# Patient Record
Sex: Female | Born: 1965 | Race: White | Hispanic: Yes | Marital: Married | Smoking: Never smoker
Health system: Southern US, Community
[De-identification: ages and names within clinical notes are randomized; demographics above are authoritative.]

## PROBLEM LIST (undated history)

## (undated) DIAGNOSIS — I10 Essential (primary) hypertension: Secondary | ICD-10-CM

## (undated) DIAGNOSIS — D219 Benign neoplasm of connective and other soft tissue, unspecified: Secondary | ICD-10-CM

## (undated) HISTORY — PX: TUBAL LIGATION: SHX77

## (undated) HISTORY — DX: Benign neoplasm of connective and other soft tissue, unspecified: D21.9

---

## 2000-07-03 ENCOUNTER — Encounter: Payer: Self-pay | Admitting: *Deleted

## 2000-07-03 ENCOUNTER — Emergency Department (HOSPITAL_COMMUNITY): Admission: EM | Admit: 2000-07-03 | Discharge: 2000-07-03 | Payer: Self-pay | Admitting: *Deleted

## 2002-06-07 ENCOUNTER — Emergency Department (HOSPITAL_COMMUNITY): Admission: EM | Admit: 2002-06-07 | Discharge: 2002-06-07 | Payer: Self-pay | Admitting: *Deleted

## 2002-06-07 ENCOUNTER — Encounter: Payer: Self-pay | Admitting: *Deleted

## 2003-07-29 ENCOUNTER — Ambulatory Visit (HOSPITAL_COMMUNITY): Admission: RE | Admit: 2003-07-29 | Discharge: 2003-07-29 | Payer: Self-pay | Admitting: Family Medicine

## 2005-08-15 ENCOUNTER — Emergency Department (HOSPITAL_COMMUNITY): Admission: EM | Admit: 2005-08-15 | Discharge: 2005-08-15 | Payer: Self-pay | Admitting: Emergency Medicine

## 2005-08-16 ENCOUNTER — Emergency Department (HOSPITAL_COMMUNITY): Admission: EM | Admit: 2005-08-16 | Discharge: 2005-08-16 | Payer: Self-pay | Admitting: Emergency Medicine

## 2006-11-14 ENCOUNTER — Other Ambulatory Visit: Admission: RE | Admit: 2006-11-14 | Discharge: 2006-11-14 | Payer: Self-pay | Admitting: Internal Medicine

## 2006-12-05 ENCOUNTER — Ambulatory Visit (HOSPITAL_COMMUNITY): Admission: RE | Admit: 2006-12-05 | Discharge: 2006-12-05 | Payer: Self-pay | Admitting: Internal Medicine

## 2007-09-11 ENCOUNTER — Emergency Department (HOSPITAL_COMMUNITY): Admission: EM | Admit: 2007-09-11 | Discharge: 2007-09-11 | Payer: Self-pay | Admitting: Emergency Medicine

## 2008-07-27 ENCOUNTER — Emergency Department (HOSPITAL_COMMUNITY): Admission: EM | Admit: 2008-07-27 | Discharge: 2008-07-27 | Payer: Self-pay | Admitting: Emergency Medicine

## 2010-03-20 ENCOUNTER — Encounter: Payer: Self-pay | Admitting: Family Medicine

## 2010-06-07 LAB — URINE MICROSCOPIC-ADD ON

## 2010-06-07 LAB — URINALYSIS, ROUTINE W REFLEX MICROSCOPIC
Glucose, UA: NEGATIVE mg/dL
Ketones, ur: NEGATIVE mg/dL
Leukocytes, UA: NEGATIVE
Protein, ur: NEGATIVE mg/dL
Urobilinogen, UA: 0.2 mg/dL (ref 0.0–1.0)
pH: 5.5 (ref 5.0–8.0)

## 2010-06-07 LAB — PREGNANCY, URINE: Preg Test, Ur: NEGATIVE

## 2010-12-06 ENCOUNTER — Other Ambulatory Visit (HOSPITAL_COMMUNITY): Payer: Self-pay | Admitting: General Surgery

## 2010-12-06 DIAGNOSIS — Z139 Encounter for screening, unspecified: Secondary | ICD-10-CM

## 2010-12-12 ENCOUNTER — Ambulatory Visit (HOSPITAL_COMMUNITY)
Admission: RE | Admit: 2010-12-12 | Discharge: 2010-12-12 | Disposition: A | Discharge status: Home / Self Care | Payer: BC Managed Care – PPO | Source: Ambulatory Visit | Attending: General Surgery | Admitting: General Surgery

## 2010-12-12 DIAGNOSIS — Z139 Encounter for screening, unspecified: Secondary | ICD-10-CM

## 2010-12-12 DIAGNOSIS — Z1231 Encounter for screening mammogram for malignant neoplasm of breast: Secondary | ICD-10-CM | POA: Insufficient documentation

## 2012-06-27 ENCOUNTER — Other Ambulatory Visit (HOSPITAL_COMMUNITY): Payer: Self-pay | Admitting: *Deleted

## 2012-06-27 DIAGNOSIS — Z139 Encounter for screening, unspecified: Secondary | ICD-10-CM

## 2012-07-02 ENCOUNTER — Ambulatory Visit (HOSPITAL_COMMUNITY)
Admission: RE | Admit: 2012-07-02 | Discharge: 2012-07-02 | Disposition: A | Discharge status: Home / Self Care | Payer: BC Managed Care – PPO | Source: Ambulatory Visit | Attending: *Deleted | Admitting: *Deleted

## 2012-07-02 DIAGNOSIS — Z1231 Encounter for screening mammogram for malignant neoplasm of breast: Secondary | ICD-10-CM | POA: Insufficient documentation

## 2012-07-02 DIAGNOSIS — Z139 Encounter for screening, unspecified: Secondary | ICD-10-CM

## 2012-08-27 ENCOUNTER — Encounter (HOSPITAL_COMMUNITY): Payer: Self-pay | Admitting: *Deleted

## 2012-08-27 ENCOUNTER — Emergency Department (HOSPITAL_COMMUNITY)
Admission: EM | Admit: 2012-08-27 | Discharge: 2012-08-27 | Disposition: A | Discharge status: Home / Self Care | Payer: BC Managed Care – PPO | Attending: Emergency Medicine | Admitting: Emergency Medicine

## 2012-08-27 ENCOUNTER — Emergency Department (HOSPITAL_COMMUNITY): Payer: BC Managed Care – PPO

## 2012-08-27 DIAGNOSIS — R109 Unspecified abdominal pain: Secondary | ICD-10-CM

## 2012-08-27 DIAGNOSIS — M7989 Other specified soft tissue disorders: Secondary | ICD-10-CM | POA: Insufficient documentation

## 2012-08-27 DIAGNOSIS — R110 Nausea: Secondary | ICD-10-CM | POA: Insufficient documentation

## 2012-08-27 DIAGNOSIS — Z3202 Encounter for pregnancy test, result negative: Secondary | ICD-10-CM | POA: Insufficient documentation

## 2012-08-27 DIAGNOSIS — R1031 Right lower quadrant pain: Secondary | ICD-10-CM | POA: Insufficient documentation

## 2012-08-27 LAB — URINALYSIS, ROUTINE W REFLEX MICROSCOPIC
Bilirubin Urine: NEGATIVE
Ketones, ur: NEGATIVE mg/dL
Leukocytes, UA: NEGATIVE
Nitrite: NEGATIVE
Specific Gravity, Urine: 1.025 (ref 1.005–1.030)
Urobilinogen, UA: 0.2 mg/dL (ref 0.0–1.0)
pH: 6 (ref 5.0–8.0)

## 2012-08-27 LAB — PREGNANCY, URINE: Preg Test, Ur: NEGATIVE

## 2012-08-27 LAB — URINE MICROSCOPIC-ADD ON

## 2012-08-27 LAB — CBC WITH DIFFERENTIAL/PLATELET
Basophils Relative: 0 % (ref 0–1)
Hemoglobin: 12.5 g/dL (ref 12.0–15.0)
Lymphocytes Relative: 19 % (ref 12–46)
MCHC: 33.1 g/dL (ref 30.0–36.0)
Neutro Abs: 9 10*3/uL — ABNORMAL HIGH (ref 1.7–7.7)
Platelets: 330 10*3/uL (ref 150–400)
RBC: 4.37 MIL/uL (ref 3.87–5.11)
RDW: 14.6 % (ref 11.5–15.5)
WBC: 12 10*3/uL — ABNORMAL HIGH (ref 4.0–10.5)

## 2012-08-27 LAB — COMPREHENSIVE METABOLIC PANEL
Calcium: 9.5 mg/dL (ref 8.4–10.5)
GFR calc non Af Amer: >90 mL/min (ref >90)
Potassium: 3.6 mEq/L (ref 3.5–5.1)

## 2012-08-27 MED ORDER — IOHEXOL 300 MG/ML  SOLN
50.0000 mL | Freq: Once | INTRAMUSCULAR | Status: AC | PRN
  Administered 2012-08-27: 50 mL via ORAL

## 2012-08-27 MED ORDER — HYDROCODONE-ACETAMINOPHEN 5-325 MG PO TABS: 1.0000 | ORAL_TABLET | Freq: Four times a day (QID) | ORAL | Status: DC | PRN

## 2012-08-27 MED ORDER — IOHEXOL 300 MG/ML  SOLN
100.0000 mL | Freq: Once | INTRAMUSCULAR | Status: AC | PRN
  Administered 2012-08-27: 100 mL via INTRAVENOUS

## 2012-08-27 MED ORDER — ONDANSETRON HCL 4 MG/2ML IJ SOLN
4.0000 mg | Freq: Once | INTRAMUSCULAR | Status: AC
  Administered 2012-08-27: 4 mg via INTRAVENOUS
  Filled 2012-08-27: qty 2

## 2012-08-27 MED ORDER — HYDROMORPHONE HCL PF 1 MG/ML IJ SOLN
1.0000 mg | Freq: Once | INTRAMUSCULAR | Status: AC
  Administered 2012-08-27: 1 mg via INTRAVENOUS
  Filled 2012-08-27: qty 1

## 2012-08-27 NOTE — ED Provider Notes (Signed)
History     This chart was scribed for Sandra Lennert, MD, MD by Smitty Pluck, ED Scribe. The patient was seen in room APA02/APA02 and the patient's care was started at 6:50PM.  CSN: [id number] Arrival date & time 08/27/12  1632  Chief Complaint  Patient presents with  . Abdominal Pain  . Nausea  . Leg Swelling   Patient is a 47 y.o. female presenting with abdominal pain. The history is provided by the patient and medical records. No language interpreter was used.  Abdominal Pain This is a new problem. The current episode started more than 2 days ago. The problem occurs constantly. The problem has not changed since onset.Associated symptoms include abdominal pain. Pertinent negatives include no chest pain and no headaches. Nothing aggravates the symptoms. Nothing relieves the symptoms. She has tried nothing for the symptoms.   HPI Comments: Sandra Benson is a 47 y.o. female who presents to the Emergency Department complaining of constant, moderate RLQ pain onset 3 days ago. Pt denies trauma to abdomen, fever, chills, nausea, vomiting, diarrhea, weakness, cough, SOB and any other pain.   History reviewed. No pertinent past medical history. History reviewed. No pertinent past surgical history. No family history on file. History  Substance Use Topics  . Smoking status: Never Smoker   . Smokeless tobacco: Not on file  . Alcohol Use: No   OB History   Grav Para Term Preterm Abortions TAB SAB Ect Mult Living                 Review of Systems  Constitutional: Negative for appetite change and fatigue.  HENT: Negative for congestion, sinus pressure and ear discharge.   Eyes: Negative for discharge.  Respiratory: Negative for cough.   Cardiovascular: Negative for chest pain.  Gastrointestinal: Positive for abdominal pain. Negative for diarrhea.  Genitourinary: Negative for frequency and hematuria.  Musculoskeletal: Negative for back pain.  Skin: Negative for rash.   Neurological: Negative for seizures and headaches.  Psychiatric/Behavioral: Negative for hallucinations.    Allergies  Review of patient's allergies indicates no known allergies.  Home Medications  No current outpatient prescriptions on file. BP 176/78  Pulse 71  Temp(Src) 98.9 F (37.2 C)  Resp 20  Ht 5\' 1"  (1.549 m)  Wt 165 lb (74.844 kg)  BMI 31.19 kg/m2  SpO2 100%  LMP 08/11/2012 Physical Exam  Nursing note and vitals reviewed. Constitutional: She is oriented to person, place, and time. She appears well-developed.  HENT:  Head: Normocephalic.  Eyes: Conjunctivae and EOM are normal. No scleral icterus.  Neck: Neck supple. No thyromegaly present.  Cardiovascular: Normal rate and regular rhythm.  Exam reveals no gallop and no friction rub.   No murmur heard. Pulmonary/Chest: No stridor. She has no wheezes. She has no rales. She exhibits no tenderness.  Abdominal: She exhibits no distension. There is tenderness (moderate RLQ ). There is no rebound.  Musculoskeletal: Normal range of motion. She exhibits no edema.  Lymphadenopathy:    She has no cervical adenopathy.  Neurological: She is oriented to person, place, and time. Coordination normal.  Skin: No rash noted. No erythema.  Psychiatric: She has a normal mood and affect. Her behavior is normal.    ED Course  Procedures (including critical care time) DIAGNOSTIC STUDIES: Oxygen Saturation is 100% on room air, normal by my interpretation.    COORDINATION OF CARE: 6:57 PM Discussed ED treatment with pt and pt agrees.  Medications  HYDROmorphone (DILAUDID) injection 1 mg (1  mg Intravenous Given 08/27/12 1920)  ondansetron (ZOFRAN) injection 4 mg (4 mg Intravenous Given 08/27/12 1920)  iohexol (OMNIPAQUE) 300 MG/ML solution 50 mL (50 mLs Oral Contrast Given 08/27/12 2021)    9:56 PM Recheck: Discussed lab results and treatment course with pt. Pt is told that she has fibroids. Pt is ready for discharge.    Labs Reviewed   URINALYSIS, ROUTINE W REFLEX MICROSCOPIC - Abnormal; Notable for the following:    Hgb urine dipstick SMALL (*)    All other components within normal limits  CBC WITH DIFFERENTIAL - Abnormal; Notable for the following:    WBC 12.0 (*)    Neutro Abs 9.0 (*)    All other components within normal limits  PREGNANCY, URINE  COMPREHENSIVE METABOLIC PANEL  URINE MICROSCOPIC-ADD ON   No results found. No diagnosis found.  MDM  Abdominal pain from fibroids  The chart was scribed for me under my direct supervision.  I personally performed the history, physical, and medical decision making and all procedures in the evaluation of this patient.Sandra Lennert, MD 08/27/12 2202

## 2012-08-27 NOTE — ED Notes (Signed)
Pt c/o lrq pain and nausea since Saturday.

## 2012-09-04 ENCOUNTER — Ambulatory Visit (INDEPENDENT_AMBULATORY_CARE_PROVIDER_SITE_OTHER): Payer: BC Managed Care – PPO | Admitting: Obstetrics and Gynecology

## 2012-09-04 ENCOUNTER — Encounter: Payer: Self-pay | Admitting: Obstetrics and Gynecology

## 2012-09-04 VITALS — BP 130/82 | Ht 61.0 in | Wt 169.4 lb

## 2012-09-04 DIAGNOSIS — N841 Polyp of cervix uteri: Secondary | ICD-10-CM

## 2012-09-04 DIAGNOSIS — D259 Leiomyoma of uterus, unspecified: Secondary | ICD-10-CM

## 2012-09-04 NOTE — Patient Instructions (Signed)
Fibroma uterino  (Uterine Fibroid)  Un fibroma uterino es Facilities manager (tumor) dentro del tero de [place]. Este tipo de tumor no es Insurance risk surveyor y no se extiende fuera del tero. Una mujer puede tener uno o varios fibromas y algunos pueden ser bastante grandes. Un fibroma puede variar en tamao, peso y Immunologist en que se desarrolla dentro del tero. La mayora de los fibromas no necesitan tratamiento mdico, pero algunos pueden cauInformacin sobre la histerectoma  (Hysterectomy Information)  Neomia Dear histerectoma es un procedimiento en el que se extirpa quirrgicamente el tero. Ya no tendr perodos menstruales ni quedar embarazada. Tambin durante esta ciruga podrn extirparle las trompas y los ovarios (salpingo-ooforectoma bilateral).  RAZONES PARA REALIZAR UNA HISTERECTOMA   Sangrado persistente, anormal.  Infeccin o dolor plvico de larga duracin (crnico).  El revestimiento del tero (endometrio) comienza a desarrollarse fuera del tero (endometriosis).  El endometrio comienza a desarrollarse en el msculo del tero (adenomiosis).  El tero desciende hacia la vagina (prolapso de los rganos plvicos).  Fibromas uterinos sintomticos.  Clulas precancerosas.  Cncer cervical o cncer uterino. TIPOS DE HISTERECTOMA   Histerectoma supracervical. Este tipo de ciruga elimina la parte superior del tero, pero no el cuello del tero.  Histerectoma total. Se extirpa el tero y el cuello uterino.  Histerectoma radical. Se extrae el tero, el cuello uterino y el tejido fibroso que sostiene el tero en su lugar en la pelvis (parametrio). FORMAS EN QUE SE PUEDE REALIZAR UNA HISTERECTOMA   Histerectoma abdominal. Se realiza gran corte quirrgico (incisin) en el abdomen. Se extirpa el tero a travs de esta incisin.  Histerectoma vaginal. Se hace una incisin en la vagina. Se extirpa el tero a travs de esta incisin. No hay incisiones abdominales.  Histerectoma  laparoscpica convencional. Se inserta un tubo delgado que emite luz y que posee una cmara (laparoscopio) en 3 o 4 incisiones pequeas en el abdomen. El tero se corta en trozos pequeos. Las piezas pequeas se eliminan a travs de las incisiones, o se retiran a travs de la vagina.  Histerectoma laparoscpica vaginal asistida. Se hacen tres o cuatro incisiones pequeas en el abdomen. Parte de la ciruga se realiza por va laparoscpica y parte por va vaginal. El tero se extirpa a travs de la vagina.  Histerectoma laparoscpica asistida por robot. Se inserta un laparoscopio en 3 o 4 incisiones pequeas en el abdomen. Se utiliza un dispositivo controlado por computadora para que el cirujano vea una imagen en 3D. Esto permite movimientos ms precisos de los instrumentos quirrgicos. El tero se corta en trozos pequeos y se retira a travs de las incisiones o se elimina a travs de la vagina. RIESGOS DE LA HISTERECTOMA   Hemorragia y riesgos de necesitar una transfusin sangunea. Informe a su mdico si no quiere recibir productos de Risk manager.  Cogulos de [employer] piernas o los pulmones.  Infecciones.  Lesin en los rganos circundantes.  Problemas o efectos secundarios por la anestesia.  Conversin a una histerectoma abdominal. QU ESPERAR DESPUES DE UNA HISTERECTOMA   Le administrarn medicamentos para calmar el dolor.  Necesitar que alguien permanezca con usted durante los primeros 3 a 5 das despus de regresar a Higher education careers adviser.  Tendr que hacer un control con su cirujano en 2 a 4 semanas despus de la ciruga para evaluar su progreso.  Podr tener sntomas tempranos de menopausia, como sofocos, sudores nocturnos e insomnio.  Si le han realizado una histerectoma por un problema que no era cncer u  otra enfermedad que podra causar cncer, ya no necesitar un Papanicolaou. Sin embargo, si ya no necesita hacerse un Papanicolau, es una buena idea hacerse un examen regularmente  para asegurarse de que no hay otros problemas. Document Released: 02/13/2005 Document Revised: 05/08/2011 [facility] Patient Information 2014 [place], [place].  CAUSAS  Un fibroma es el resultado del desarrollo continuo de una nica clula uterina que sigue creciendo (no regulada) que es diferente al resto de las clulas del cuerpo humano. La mayora de las clulas tiene un mecanismo de control que evita que se reproduzcan de Psychologist, occupational.  SNTOMAS   Sangrado.  Dolor y sensacin de presin en la pelvis.  Problemas en la vejiga debido al tamao del fibroma.  Infertilidad y abortos espontneos, segn el tamao y la ubicacin del fibroma. DIAGNSTICO  El diagnstico se hace por examen fsico. El mdico puede palpar los tumores abultados al realizar el examen de la pelvis. Una ecografa puede brindar informacin adicional importante acerca del tamao, la ubicacin y el nmero de tumores. Es raro que sea Passenger transport manager otras pruebas, como tomografa computada o Health visitor.  TRATAMIENTO   Su mdico puede considerar que es conveniente esperar y Pharmacologist. Esto incluye el control del fibroma por parte del mdico para observar si crece o disminuye su tamao.   Podr recomendarle un tratamiento hormonal o el uso de un dispositivo intrauterino (DIU).   En algunos casos es necesaria la ciruga para extirpar el fibroma (miomectoma) o el tero (histerectoma). Esto depender de su situacin. Cuando una mujer desea quedar embarazada y los fibromas interfieren en su fertilidad, el mdico puede recomendar la extirpacin del fibroma.  INSTRUCCIONES PARA EL CUIDADO EN EL HOGAR  Los cuidados en el hogar dependen del tratamiento que haya recibido. En general:   Concurra puntualmente a las citas de control con el mdico.   Slo tome los medicamentos que le indic su mdico. No tome aspirina. Puede ocasionar hemorragias.   Si tiene perodos muy abundantes y debe cambiar un  tampn o una toalla higinica en media hora o menos, comunquese con su mdico inmediatamente. Si sus perodos son molestos pero no tan abundantes, acustese con los pies ligeramente elevados por encima del nivel del corazn. Coloque compresas fras en la zona inferior del abdomen.   Si sus perodos son muy abundantes  , anote el nmero de compresas o tampones que [place] cada mes. Lleve esta informacin cuando visite a su mdico.   Consulte al mdico si debe tomar pldoras de hierro.   Incluya vegetales en su dieta.   Si le recetaron un tratamiento hormonal, tome los medicamentos hormonales como le indicaron.   Si le indicaron la ciruga, pdale informacin especfica a su mdico.  SOLICITE ATENCIN MDICA DE INMEDIATO SI:   Siente dolor o clicos en la pelvis y no puede controlarlos con los medicamentos.   El dolor en la pelvis aumenta de manera repentina.   Aumenta el sangrado entre los perodos o [employer].   Se siente mareado o tiene episodios de [place].  ASEGRESE DE QUE:   Comprende estas instrucciones.  Controlar su enfermedad.  Solicitar ayuda de inmediato si no mejora o si empeora. Document Released: 02/13/2005 Document Revised: 05/08/2011 [facility] Patient Information 2014 [place], [place].

## 2012-09-04 NOTE — Progress Notes (Signed)
Patient ID: Sandra Benson, female   DOB: 1966/02/03, 47 y.o.   MRN: [id number]   [facility] Clinic Visit  Patient name: Sandra Benson MRN [id number]  Date of birth: 12/05/1965  CC & HPI:  Sandra Benson is a 47 y.o. female presenting today for  Er f/u.Marland KitchenMarland KitchenPt here today to follow up from the ER. Pt states she is having some pain in her lower abdominal area and sometimes on the sides, and has some bloating as well. Pt states her last period was very heavy and had big clots, and a few months ago as well.  Menses June 15 , pmp 2 mo before. Receives usual care at [facility].  ROS:  See hpi   Pertinent History Reviewed:  Medical & Surgical Hx:  Reviewed: Significant for  Medications: Reviewed & Updated - see associated section Social History: Reviewed -  reports that she has never smoked. She does not have any smokeless tobacco history on file.  Objective Findings:  Vitals: BP 130/82  Ht 5\' 1"  (1.549 m)  Wt 169 lb 6.4 oz (76.839 kg)  BMI 32.02 kg/m2  LMP 08/11/2012  Physical Examination: General appearance - alert, well appearing, and in no distress and oriented to person, place, and time Chest - clear to auscultation, no wheezes, rales or rhonchi, symmetric air entry Abdomen - soft, nontender, nondistended, no masses or organomegaly Uterus enlarged to 16 wk size uterus Pelvic - VULVA: normal appearing vulva with no masses, tenderness or lesions, VAGINA: normal appearing vagina with normal color and discharge, no lesions, CERVIX: cervical polyp, UTERUS: enlarged to 16 week's size   Assessment & Plan:  16 wk fibroid uterus Cervical polyp  Return promptly for removal of cx polyp and endometrial biopsy     Family [facility] Clinic Visit  Patient name: Sandra Benson MRN [id number]  Date of birth: March 04, 1965

## 2012-09-19 ENCOUNTER — Ambulatory Visit (INDEPENDENT_AMBULATORY_CARE_PROVIDER_SITE_OTHER): Payer: BC Managed Care – PPO | Admitting: Obstetrics and Gynecology

## 2012-09-19 ENCOUNTER — Encounter: Payer: Self-pay | Admitting: Obstetrics and Gynecology

## 2012-09-19 ENCOUNTER — Other Ambulatory Visit (HOSPITAL_COMMUNITY)
Admission: RE | Admit: 2012-09-19 | Discharge: 2012-09-19 | Disposition: A | Discharge status: Home / Self Care | Payer: BC Managed Care – PPO | Source: Ambulatory Visit | Attending: Obstetrics and Gynecology | Admitting: Obstetrics and Gynecology

## 2012-09-19 VITALS — BP 116/68 | Ht 61.0 in | Wt 172.0 lb

## 2012-09-19 DIAGNOSIS — Z01818 Encounter for other preprocedural examination: Secondary | ICD-10-CM

## 2012-09-19 DIAGNOSIS — Z01419 Encounter for gynecological examination (general) (routine) without abnormal findings: Secondary | ICD-10-CM

## 2012-09-19 DIAGNOSIS — R109 Unspecified abdominal pain: Secondary | ICD-10-CM

## 2012-09-19 DIAGNOSIS — N841 Polyp of cervix uteri: Secondary | ICD-10-CM

## 2012-09-19 DIAGNOSIS — D259 Leiomyoma of uterus, unspecified: Secondary | ICD-10-CM

## 2012-09-19 DIAGNOSIS — Z113 Encounter for screening for infections with a predominantly sexual mode of transmission: Secondary | ICD-10-CM | POA: Insufficient documentation

## 2012-09-19 DIAGNOSIS — Z1151 Encounter for screening for human papillomavirus (HPV): Secondary | ICD-10-CM | POA: Insufficient documentation

## 2012-09-19 DIAGNOSIS — Z3202 Encounter for pregnancy test, result negative: Secondary | ICD-10-CM

## 2012-09-19 LAB — POCT URINE PREGNANCY: Preg Test, Ur: NEGATIVE

## 2012-09-19 NOTE — Progress Notes (Addendum)
Patient ID: Sandra Benson, female   DOB: 01-09-1966, 47 y.o.   MRN: [id number] Pt here today following up for uterine fibroid and cervical polyp.

## 2012-10-01 ENCOUNTER — Telehealth: Payer: Self-pay | Admitting: Obstetrics and Gynecology

## 2012-10-07 ENCOUNTER — Encounter (HOSPITAL_COMMUNITY): Payer: Self-pay | Admitting: Pharmacy Technician

## 2012-10-10 ENCOUNTER — Telehealth: Payer: Self-pay | Admitting: Obstetrics and Gynecology

## 2012-10-10 NOTE — Telephone Encounter (Signed)
Done

## 2012-10-11 NOTE — Telephone Encounter (Signed)
Letter left at front desk stating the date of surgery 10/22/2012, and pt will need 8 weeks recovery time. Pt aware.

## 2012-10-16 NOTE — Patient Instructions (Addendum)
Binnie Droessler  10/16/2012   Your procedure is scheduled on:  10/22/2012  Report to [facility] at  615  AM.  Call this number if you have problems the morning of surgery: [phone]   Remember:   Do not eat food or drink liquids after midnight.   Take these medicines the morning of surgery with A SIP OF WATER: none   Do not wear jewelry, make-up or nail polish.  Do not wear lotions, powders, or perfumes.   Do not shave 48 hours prior to surgery. Men may shave face and neck.  Do not bring valuables to the hospital.  [facility] is not responsible for any belongings or valuables.  Contacts, dentures or bridgework may not be worn into surgery.  Leave suitcase in the car. After surgery it may be brought to your room.  For patients admitted to the hospital, checkout time is 11:00 AM the day of discharge.   Patients discharged the day of surgery will not be allowed to drive home.  Name and phone number of your driver: family  Special Instructions: Shower using CHG 2 nights before surgery and the night before surgery.  If you shower the day of surgery use CHG.  Use special wash - you have one bottle of CHG for all showers.  You should use approximately 1/3 of the bottle for each shower.   Please read over the following fact sheets that you were given: Pain Booklet, Coughing and Deep Breathing, Surgical Site Infection Prevention, Anesthesia Post-op Instructions and Care and Recovery After Surgery Hysterectomy Information  A hysterectomy is a procedure where your uterus is surgically removed. It will no longer be possible to have menstrual periods or to become pregnant. The tubes and ovaries can be removed (bilateral salpingo-oopherectomy) during this surgery as well.  REASONS FOR A HYSTERECTOMY  Persistent, abnormal bleeding.  Lasting (chronic) pelvic pain or infection.  The lining of the uterus (endometrium) starts growing outside the uterus (endometriosis).  The endometrium starts  growing in the muscle of the uterus (adenomyosis).  The uterus falls down into the vagina (pelvic organ prolapse).  Symptomatic uterine fibroids.  Precancerous cells.  Cervical cancer or uterine cancer. TYPES OF HYSTERECTOMIES  Supracervical hysterectomy. This type removes the top part of the uterus, but not the cervix.  Total hysterectomy. This type removes the uterus and cervix.  Radical hysterectomy. This type removes the uterus, cervix, and the fibrous tissue that holds the uterus in place in the pelvis (parametrium). WAYS A HYSTERECTOMY CAN BE PERFORMED  Abdominal hysterectomy. A large surgical cut (incision) is made in the abdomen. The uterus is removed through this incision.  Vaginal hysterectomy. An incision is made in the vagina. The uterus is removed through this incision. There are no abdominal incisions.  Conventional laparoscopic hysterectomy. A thin, lighted tube with a camera (laparoscope) is inserted into 3 or 4 small incisions in the abdomen. The uterus is cut into small pieces. The small pieces are removed through the incisions, or they are removed through the vagina.  Laparoscopic assisted vaginal hysterectomy (LAVH). Three or four small incisions are made in the abdomen. Part of the surgery is performed laparoscopically and part vaginally. The uterus is removed through the vagina.  Robot-assisted laparoscopic hysterectomy. A laparoscope is inserted into 3 or 4 small incisions in the abdomen. A computer-controlled device is used to give the surgeon a 3D image. This allows for more precise movements of surgical instruments. The uterus is cut  into small pieces and removed through the incisions or removed through the vagina. RISKS OF HYSTERECTOMY   Bleeding and risk of blood transfusion. Tell your caregiver if you do not want to receive any blood products.  Blood clots in the legs or lung.  Infection.  Injury to surrounding organs.  Anesthesia problems or side  effects.  Conversion to an abdominal hysterectomy. WHAT TO EXPECT AFTER A HYSTERECTOMY  You will be given pain medicine.  You will need to have someone with you for the first 3 to 5 days after you go home.  You will need to follow up with your surgeon in 2 to 4 weeks after surgery to evaluate your progress.  You may have early menopause symptoms like hot flashes, night sweats, and insomnia.  If you had a hysterectomy for a problem that was not a cancer or a condition that could lead to cancer, then you no longer need Pap tests. However, even if you no longer need a Pap test, a regular exam is a good idea to make sure no other problems are starting. Document Released: 08/09/2000 Document Revised: 05/08/2011 Document Reviewed: 09/24/2010 [facility] Patient Information 2014 [place], [place]. PATIENT INSTRUCTIONS POST-ANESTHESIA  IMMEDIATELY FOLLOWING SURGERY:  Do not drive or operate machinery for the first twenty four hours after surgery.  Do not make any important decisions for twenty four hours after surgery or while taking narcotic pain medications or sedatives.  If you develop intractable nausea and vomiting or a severe headache please notify your doctor immediately.  FOLLOW-UP:  Please make an appointment with your surgeon as instructed. You do not need to follow up with anesthesia unless specifically instructed to do so.  WOUND CARE INSTRUCTIONS (if applicable):  Keep a dry clean dressing on the anesthesia/puncture wound site if there is drainage.  Once the wound has quit draining you may leave it open to air.  Generally you should leave the bandage intact for twenty four hours unless there is drainage.  If the epidural site drains for more than 36-48 hours please call the anesthesia department.  QUESTIONS?:  Please feel free to call your physician or the hospital operator if you have any questions, and they will be happy to assist you.

## 2012-10-17 ENCOUNTER — Encounter (HOSPITAL_COMMUNITY): Payer: Self-pay

## 2012-10-17 ENCOUNTER — Encounter (HOSPITAL_COMMUNITY)
Admission: RE | Admit: 2012-10-17 | Discharge: 2012-10-17 | Disposition: A | Discharge status: Home / Self Care | Payer: BC Managed Care – PPO | Source: Ambulatory Visit | Attending: Obstetrics and Gynecology | Admitting: Obstetrics and Gynecology

## 2012-10-17 ENCOUNTER — Other Ambulatory Visit: Payer: Self-pay | Admitting: Obstetrics and Gynecology

## 2012-10-17 DIAGNOSIS — D649 Anemia, unspecified: Secondary | ICD-10-CM | POA: Insufficient documentation

## 2012-10-17 DIAGNOSIS — Z01818 Encounter for other preprocedural examination: Secondary | ICD-10-CM | POA: Insufficient documentation

## 2012-10-17 LAB — CBC WITH DIFFERENTIAL/PLATELET
Basophils Absolute: 0 10*3/uL (ref 0.0–0.1)
HCT: 31.5 % — ABNORMAL LOW (ref 36.0–46.0)
Lymphocytes Relative: 22 % (ref 12–46)
Lymphs Abs: 1.9 10*3/uL (ref 0.7–4.0)
Monocytes Absolute: 0.4 10*3/uL (ref 0.1–1.0)
Neutro Abs: 6.4 10*3/uL (ref 1.7–7.7)
Platelets: 327 10*3/uL (ref 150–400)
RBC: 3.67 MIL/uL — ABNORMAL LOW (ref 3.87–5.11)
RDW: 13.8 % (ref 11.5–15.5)
WBC: 8.9 10*3/uL (ref 4.0–10.5)

## 2012-10-17 LAB — COMPREHENSIVE METABOLIC PANEL
ALT: 16 U/L (ref 0–35)
AST: 17 U/L (ref 0–37)
CO2: 27 mEq/L (ref 19–32)
Chloride: 99 mEq/L (ref 96–112)
GFR calc non Af Amer: >90 mL/min (ref >90)
Sodium: 136 mEq/L (ref 135–145)
Total Bilirubin: 0.4 mg/dL (ref 0.3–1.2)

## 2012-10-17 LAB — URINE MICROSCOPIC-ADD ON

## 2012-10-17 LAB — URINALYSIS, ROUTINE W REFLEX MICROSCOPIC
Bilirubin Urine: NEGATIVE
Ketones, ur: NEGATIVE mg/dL
Protein, ur: NEGATIVE mg/dL
Urobilinogen, UA: 0.2 mg/dL (ref 0.0–1.0)

## 2012-10-18 LAB — PREPARE RBC (CROSSMATCH)

## 2012-10-18 NOTE — Pre-Procedure Instructions (Signed)
Dr Jayme Cloud aware of  H & H. Wants crossmatch X2. Order placed in computer and blood bank notified, spoke with Lurena Joiner. Dr Emelda Fear also notified.

## 2012-10-22 ENCOUNTER — Encounter (HOSPITAL_COMMUNITY)
Admission: RE | Disposition: A | Discharge status: Home / Self Care | Payer: Self-pay | Source: Ambulatory Visit | Attending: Obstetrics and Gynecology

## 2012-10-22 ENCOUNTER — Encounter (HOSPITAL_COMMUNITY): Payer: Self-pay | Admitting: Anesthesiology

## 2012-10-22 ENCOUNTER — Inpatient Hospital Stay (HOSPITAL_COMMUNITY): Payer: BC Managed Care – PPO | Admitting: Anesthesiology

## 2012-10-22 ENCOUNTER — Inpatient Hospital Stay (HOSPITAL_COMMUNITY)
Admission: RE | Admit: 2012-10-22 | Discharge: 2012-10-24 | DRG: 359 | Disposition: A | Discharge status: Home / Self Care | Payer: BC Managed Care – PPO | Source: Ambulatory Visit | Attending: Obstetrics and Gynecology | Admitting: Obstetrics and Gynecology

## 2012-10-22 ENCOUNTER — Encounter (HOSPITAL_COMMUNITY): Payer: Self-pay | Admitting: *Deleted

## 2012-10-22 DIAGNOSIS — N8000 Endometriosis of the uterus, unspecified: Secondary | ICD-10-CM | POA: Diagnosis present

## 2012-10-22 DIAGNOSIS — N852 Hypertrophy of uterus: Secondary | ICD-10-CM | POA: Diagnosis present

## 2012-10-22 DIAGNOSIS — N949 Unspecified condition associated with female genital organs and menstrual cycle: Secondary | ICD-10-CM

## 2012-10-22 DIAGNOSIS — D649 Anemia, unspecified: Secondary | ICD-10-CM | POA: Diagnosis present

## 2012-10-22 DIAGNOSIS — D252 Subserosal leiomyoma of uterus: Secondary | ICD-10-CM

## 2012-10-22 DIAGNOSIS — D251 Intramural leiomyoma of uterus: Secondary | ICD-10-CM

## 2012-10-22 DIAGNOSIS — N841 Polyp of cervix uteri: Secondary | ICD-10-CM | POA: Diagnosis present

## 2012-10-22 DIAGNOSIS — D259 Leiomyoma of uterus, unspecified: Secondary | ICD-10-CM | POA: Diagnosis present

## 2012-10-22 DIAGNOSIS — Z9889 Other specified postprocedural states: Secondary | ICD-10-CM

## 2012-10-22 DIAGNOSIS — Z833 Family history of diabetes mellitus: Secondary | ICD-10-CM

## 2012-10-22 DIAGNOSIS — N840 Polyp of corpus uteri: Secondary | ICD-10-CM

## 2012-10-22 HISTORY — PX: ABDOMINAL HYSTERECTOMY: SHX81

## 2012-10-22 SURGERY — HYSTERECTOMY, ABDOMINAL
Anesthesia: General | Site: Abdomen | Wound class: Clean Contaminated

## 2012-10-22 MED ORDER — LACTATED RINGERS IV SOLN
INTRAVENOUS | Status: DC | PRN
  Administered 2012-10-22 (×3): via INTRAVENOUS

## 2012-10-22 MED ORDER — SUFENTANIL CITRATE 50 MCG/ML IV SOLN
INTRAVENOUS | Status: DC | PRN
  Administered 2012-10-22 (×6): 10 ug via INTRAVENOUS
  Administered 2012-10-22: 20 ug via INTRAVENOUS

## 2012-10-22 MED ORDER — ONDANSETRON HCL 4 MG/2ML IJ SOLN: 4.0000 mg | Freq: Four times a day (QID) | INTRAMUSCULAR | Status: DC | PRN

## 2012-10-22 MED ORDER — ROCURONIUM BROMIDE 100 MG/10ML IV SOLN
INTRAVENOUS | Status: DC | PRN
  Administered 2012-10-22: 40 mg via INTRAVENOUS
  Administered 2012-10-22: 10 mg via INTRAVENOUS

## 2012-10-22 MED ORDER — HYDROMORPHONE 0.3 MG/ML IV SOLN
INTRAVENOUS | Status: DC
  Administered 2012-10-22: 14:00:00 via INTRAVENOUS
  Filled 2012-10-22: qty 25

## 2012-10-22 MED ORDER — ROCURONIUM BROMIDE 50 MG/5ML IV SOLN
INTRAVENOUS | Status: AC
  Filled 2012-10-22: qty 1

## 2012-10-22 MED ORDER — MIDAZOLAM HCL 2 MG/2ML IJ SOLN
INTRAMUSCULAR | Status: AC
  Filled 2012-10-22: qty 2

## 2012-10-22 MED ORDER — DOCUSATE SODIUM 100 MG PO CAPS
100.0000 mg | ORAL_CAPSULE | Freq: Two times a day (BID) | ORAL | Status: DC
  Administered 2012-10-22 – 2012-10-24 (×4): 100 mg via ORAL
  Filled 2012-10-22 (×4): qty 1

## 2012-10-22 MED ORDER — PROMETHAZINE HCL 25 MG/ML IJ SOLN
INTRAMUSCULAR | Status: AC
  Filled 2012-10-22: qty 1

## 2012-10-22 MED ORDER — CEFAZOLIN SODIUM-DEXTROSE 2-3 GM-% IV SOLR
INTRAVENOUS | Status: AC
  Filled 2012-10-22: qty 50

## 2012-10-22 MED ORDER — KETOROLAC TROMETHAMINE 30 MG/ML IJ SOLN
30.0000 mg | Freq: Four times a day (QID) | INTRAMUSCULAR | Status: DC
  Administered 2012-10-22 – 2012-10-24 (×6): 30 mg via INTRAVENOUS
  Filled 2012-10-22 (×4): qty 1

## 2012-10-22 MED ORDER — NEOSTIGMINE METHYLSULFATE 1 MG/ML IJ SOLN
INTRAMUSCULAR | Status: AC
  Filled 2012-10-22: qty 1

## 2012-10-22 MED ORDER — FENTANYL CITRATE 0.05 MG/ML IJ SOLN
INTRAMUSCULAR | Status: AC
  Filled 2012-10-22: qty 2

## 2012-10-22 MED ORDER — SIMETHICONE 80 MG PO CHEW
80.0000 mg | CHEWABLE_TABLET | Freq: Four times a day (QID) | ORAL | Status: DC | PRN
  Administered 2012-10-22 – 2012-10-23 (×3): 80 mg via ORAL
  Filled 2012-10-22 (×3): qty 1

## 2012-10-22 MED ORDER — OXYCODONE-ACETAMINOPHEN 5-325 MG PO TABS: 1.0000 | ORAL_TABLET | ORAL | Status: DC | PRN

## 2012-10-22 MED ORDER — NEOSTIGMINE METHYLSULFATE 1 MG/ML IJ SOLN
INTRAMUSCULAR | Status: DC | PRN
  Administered 2012-10-22: 4 mg via INTRAVENOUS

## 2012-10-22 MED ORDER — LACTATED RINGERS IV SOLN
INTRAVENOUS | Status: DC
  Administered 2012-10-22: 07:00:00 via INTRAVENOUS

## 2012-10-22 MED ORDER — SODIUM CHLORIDE 0.9 % IJ SOLN
INTRAMUSCULAR | Status: AC
  Filled 2012-10-22: qty 10

## 2012-10-22 MED ORDER — EPHEDRINE SULFATE 50 MG/ML IJ SOLN
INTRAMUSCULAR | Status: AC
  Filled 2012-10-22: qty 1

## 2012-10-22 MED ORDER — KCL IN DEXTROSE-NACL 20-5-0.45 MEQ/L-%-% IV SOLN
INTRAVENOUS | Status: DC
  Administered 2012-10-22 – 2012-10-23 (×3): via INTRAVENOUS

## 2012-10-22 MED ORDER — LIDOCAINE HCL (CARDIAC) 20 MG/ML IV SOLN
INTRAVENOUS | Status: DC | PRN
  Administered 2012-10-22: 50 mg via INTRAVENOUS

## 2012-10-22 MED ORDER — DIPHENHYDRAMINE HCL 50 MG/ML IJ SOLN
12.5000 mg | Freq: Four times a day (QID) | INTRAMUSCULAR | Status: DC | PRN
  Administered 2012-10-22: 12.5 mg via INTRAVENOUS
  Filled 2012-10-22: qty 1

## 2012-10-22 MED ORDER — ZOLPIDEM TARTRATE 5 MG PO TABS: 5.0000 mg | ORAL_TABLET | Freq: Every evening | ORAL | Status: DC | PRN

## 2012-10-22 MED ORDER — ACETAMINOPHEN 325 MG PO TABS: 650.0000 mg | ORAL_TABLET | ORAL | Status: DC | PRN

## 2012-10-22 MED ORDER — ONDANSETRON HCL 4 MG/2ML IJ SOLN
INTRAMUSCULAR | Status: AC
  Filled 2012-10-22: qty 2

## 2012-10-22 MED ORDER — GLYCOPYRROLATE 0.2 MG/ML IJ SOLN
INTRAMUSCULAR | Status: AC
  Filled 2012-10-22: qty 3

## 2012-10-22 MED ORDER — PANTOPRAZOLE SODIUM 40 MG PO TBEC
40.0000 mg | DELAYED_RELEASE_TABLET | Freq: Every day | ORAL | Status: DC
  Administered 2012-10-23 – 2012-10-24 (×2): 40 mg via ORAL
  Filled 2012-10-22 (×2): qty 1

## 2012-10-22 MED ORDER — EPHEDRINE SULFATE 50 MG/ML IJ SOLN
INTRAMUSCULAR | Status: DC | PRN
  Administered 2012-10-22: 10 mg via INTRAVENOUS

## 2012-10-22 MED ORDER — ONDANSETRON HCL 4 MG/2ML IJ SOLN
4.0000 mg | Freq: Once | INTRAMUSCULAR | Status: AC
  Administered 2012-10-22: 4 mg via INTRAVENOUS

## 2012-10-22 MED ORDER — KETOROLAC TROMETHAMINE 30 MG/ML IJ SOLN
30.0000 mg | Freq: Four times a day (QID) | INTRAMUSCULAR | Status: DC
  Filled 2012-10-22: qty 1

## 2012-10-22 MED ORDER — CEFAZOLIN SODIUM-DEXTROSE 2-3 GM-% IV SOLR
2.0000 g | INTRAVENOUS | Status: AC
  Administered 2012-10-22: 2 g via INTRAVENOUS

## 2012-10-22 MED ORDER — FENTANYL CITRATE 0.05 MG/ML IJ SOLN
25.0000 ug | INTRAMUSCULAR | Status: DC | PRN
  Administered 2012-10-22: 25 ug via INTRAVENOUS

## 2012-10-22 MED ORDER — ONDANSETRON HCL 4 MG PO TABS: 4.0000 mg | ORAL_TABLET | Freq: Four times a day (QID) | ORAL | Status: DC | PRN

## 2012-10-22 MED ORDER — 0.9 % SODIUM CHLORIDE (POUR BTL) OPTIME
TOPICAL | Status: DC | PRN
  Administered 2012-10-22 (×2): 1000 mL

## 2012-10-22 MED ORDER — SUFENTANIL CITRATE 50 MCG/ML IV SOLN
INTRAVENOUS | Status: AC
  Filled 2012-10-22: qty 1

## 2012-10-22 MED ORDER — PROPOFOL 10 MG/ML IV BOLUS
INTRAVENOUS | Status: DC | PRN
  Administered 2012-10-22: 10 mg via INTRAVENOUS
  Administered 2012-10-22: 150 mg via INTRAVENOUS

## 2012-10-22 MED ORDER — GLYCOPYRROLATE 0.2 MG/ML IJ SOLN
INTRAMUSCULAR | Status: DC | PRN
  Administered 2012-10-22: 0.6 mg via INTRAVENOUS

## 2012-10-22 MED ORDER — PROMETHAZINE HCL 25 MG/ML IJ SOLN
12.5000 mg | Freq: Four times a day (QID) | INTRAMUSCULAR | Status: DC | PRN
  Administered 2012-10-22: 12.5 mg via INTRAVENOUS

## 2012-10-22 MED ORDER — SUCCINYLCHOLINE CHLORIDE 20 MG/ML IJ SOLN
INTRAMUSCULAR | Status: AC
  Filled 2012-10-22: qty 1

## 2012-10-22 MED ORDER — PROPOFOL 10 MG/ML IV EMUL
INTRAVENOUS | Status: AC
  Filled 2012-10-22: qty 20

## 2012-10-22 MED ORDER — SODIUM CHLORIDE 0.9 % IJ SOLN: 9.0000 mL | INTRAMUSCULAR | Status: DC | PRN

## 2012-10-22 MED ORDER — KETOROLAC TROMETHAMINE 30 MG/ML IJ SOLN
INTRAMUSCULAR | Status: AC
  Filled 2012-10-22: qty 1

## 2012-10-22 MED ORDER — KETOROLAC TROMETHAMINE 30 MG/ML IJ SOLN
30.0000 mg | Freq: Once | INTRAMUSCULAR | Status: AC
  Administered 2012-10-22: 30 mg via INTRAVENOUS

## 2012-10-22 MED ORDER — MIDAZOLAM HCL 2 MG/2ML IJ SOLN
1.0000 mg | INTRAMUSCULAR | Status: DC | PRN
  Administered 2012-10-22: 2 mg via INTRAVENOUS

## 2012-10-22 MED ORDER — ONDANSETRON HCL 4 MG/2ML IJ SOLN
4.0000 mg | Freq: Once | INTRAMUSCULAR | Status: AC | PRN
  Administered 2012-10-22: 4 mg via INTRAVENOUS

## 2012-10-22 MED ORDER — NALOXONE HCL 0.4 MG/ML IJ SOLN: 0.4000 mg | INTRAMUSCULAR | Status: DC | PRN

## 2012-10-22 MED ORDER — LIDOCAINE HCL (PF) 1 % IJ SOLN
INTRAMUSCULAR | Status: AC
  Filled 2012-10-22: qty 5

## 2012-10-22 MED ORDER — DIPHENHYDRAMINE HCL 12.5 MG/5ML PO ELIX: 12.5000 mg | ORAL_SOLUTION | Freq: Four times a day (QID) | ORAL | Status: DC | PRN

## 2012-10-22 MED ORDER — ARTIFICIAL TEARS OP OINT
TOPICAL_OINTMENT | OPHTHALMIC | Status: AC
  Filled 2012-10-22: qty 3.5

## 2012-10-22 SURGICAL SUPPLY — 41 items
APL SKNCLS STERI-STRIP NONHPOA (GAUZE/BANDAGES/DRESSINGS) ×1
BAG HAMPER (MISCELLANEOUS) ×2 IMPLANT
BENZOIN TINCTURE PRP APPL 2/3 (GAUZE/BANDAGES/DRESSINGS) ×1 IMPLANT
CLOTH BEACON ORANGE TIMEOUT ST (SAFETY) ×2 IMPLANT
COVER LIGHT HANDLE STERIS (MISCELLANEOUS) ×4 IMPLANT
DRAPE WARM FLUID 44X44 (DRAPE) ×2 IMPLANT
DRESSING TELFA 8X3 (GAUZE/BANDAGES/DRESSINGS) ×2 IMPLANT
ELECT REM PT RETURN 9FT ADLT (ELECTROSURGICAL) ×2
ELECTRODE REM PT RTRN 9FT ADLT (ELECTROSURGICAL) ×1 IMPLANT
EVACUATOR DRAINAGE 10X20 100CC (DRAIN) IMPLANT
EVACUATOR SILICONE 100CC (DRAIN) ×2
GLOVE BIOGEL PI IND STRL 7.0 (GLOVE) IMPLANT
GLOVE BIOGEL PI INDICATOR 7.0 (GLOVE) ×2
GLOVE ECLIPSE 6.5 STRL STRAW (GLOVE) ×1 IMPLANT
GLOVE ECLIPSE 9.0 STRL (GLOVE) ×2 IMPLANT
GLOVE EXAM NITRILE LRG STRL (GLOVE) ×2 IMPLANT
GLOVE INDICATOR STER SZ 9 (GLOVE) ×2 IMPLANT
GLOVE SS BIOGEL STRL SZ 6.5 (GLOVE) IMPLANT
GLOVE SUPERSENSE BIOGEL SZ 6.5 (GLOVE) ×1
GOWN STRL REIN 3XL LVL4 (GOWN DISPOSABLE) ×2 IMPLANT
GOWN STRL REIN XL XLG (GOWN DISPOSABLE) ×4 IMPLANT
INST SET MAJOR GENERAL (KITS) ×2 IMPLANT
KIT ROOM TURNOVER APOR (KITS) ×2 IMPLANT
MANIFOLD NEPTUNE II (INSTRUMENTS) ×2 IMPLANT
NS IRRIG 1000ML POUR BTL (IV SOLUTION) ×4 IMPLANT
PACK ABDOMINAL MAJOR (CUSTOM PROCEDURE TRAY) ×2 IMPLANT
SET BASIN LINEN APH (SET/KITS/TRAYS/PACK) ×2 IMPLANT
SOL PREP PROV IODINE SCRUB 4OZ (MISCELLANEOUS) ×2 IMPLANT
SPONGE DRAIN TRACH 4X4 STRL 2S (GAUZE/BANDAGES/DRESSINGS) ×1 IMPLANT
SPONGE GAUZE 4X4 12PLY (GAUZE/BANDAGES/DRESSINGS) ×2 IMPLANT
SPONGE LAP 18X18 X RAY DECT (DISPOSABLE) ×1 IMPLANT
STRIP CLOSURE SKIN 1/2X4 (GAUZE/BANDAGES/DRESSINGS) ×4 IMPLANT
SUT CHROMIC 0 CT 1 (SUTURE) ×23 IMPLANT
SUT CHROMIC 2 0 CT 1 (SUTURE) ×3 IMPLANT
SUT ETHILON 3 0 FSL (SUTURE) ×1 IMPLANT
SUT PDS AB CT VIOLET #0 27IN (SUTURE) ×2 IMPLANT
SUT PLAIN CT 1/2CIR 2-0 27IN (SUTURE) ×2 IMPLANT
SUT VICRYL 4 0 KS 27 (SUTURE) ×2 IMPLANT
TAPE CLOTH SURG 4X10 WHT LF (GAUZE/BANDAGES/DRESSINGS) ×1 IMPLANT
TOWEL BLUE STERILE X RAY DET (MISCELLANEOUS) ×2 IMPLANT
TRAY FOLEY CATH 16FR SILVER (SET/KITS/TRAYS/PACK) ×2 IMPLANT

## 2012-10-22 NOTE — Preoperative (Signed)
Beta Blockers   Reason not to administer Beta Blockers:Not Applicable 

## 2012-10-22 NOTE — Brief Op Note (Signed)
10/22/2012  10:44 AM  PATIENT:  Sandra Benson  47 y.o. female  PRE-OPERATIVE DIAGNOSIS:  abdominal pain menometorrhagia uterine fibroids enlarged uterus polyp of cervix  POST-OPERATIVE DIAGNOSIS:  abdominal pain menometorrhagia uterine fibroids enlarged uterus polyp of cervix  PROCEDURE:  Procedure(s): HYSTERECTOMY ABDOMINAL (N/A)  SURGEON:  Surgeon(s) and Role:    * Tilda Burrow, MD - Primary  PHYSICIAN ASSISTANT:   ASSISTANTS: Wrenn, registered nurse   ANESTHESIA:   general  EBL:  Total I/O In: 2700 [I.V.:2700] Out: 720 [Urine:100; Blood:620]  BLOOD ADMINISTERED:none  DRAINS: (1) Jackson-Pratt drain(s) with closed bulb suction in the Subcutaneous fatty space   LOCAL MEDICATIONS USED:  NONE  SPECIMEN:  Source of Specimen:  Uterus and cervix  DISPOSITION OF SPECIMEN:  PATHOLOGY  COUNTS:  YES  TOURNIQUET:  * No tourniquets in log *  DICTATION: .Dragon Dictation  PLAN OF CARE: Admit to inpatient   PATIENT DISPOSITION:  PACU - hemodynamically stable.   Delay start of Pharmacological VTE agent (>24hrs) due to surgical blood loss or risk of bleeding: not applicable

## 2012-10-22 NOTE — Transfer of Care (Signed)
Immediate Anesthesia Transfer of Care Note  Patient: Sandra Benson  Procedure(s) Performed: Procedure(s): HYSTERECTOMY ABDOMINAL (N/A)  Patient Location: PACU  Anesthesia Type:General  Level of Consciousness: awake, oriented and patient cooperative  Airway & Oxygen Therapy: Patient Spontanous Breathing and Patient connected to face mask oxygen  Post-op Assessment: Report given to PACU RN and Post -op Vital signs reviewed and stable  Post vital signs: Reviewed and stable  Complications: No apparent anesthesia complications

## 2012-10-22 NOTE — Op Note (Signed)
PATIENT:  Sandra Benson  47 y.o. female  PRE-OPERATIVE DIAGNOSIS:  abdominal pain menometorrhagia uterine fibroids enlarged uterus polyp of cervix  POST-OPERATIVE DIAGNOSIS:  abdominal pain menometorrhagia uterine fibroids enlarged uterus polyp of cervix  PROCEDURE:  Procedure(s): HYSTERECTOMY ABDOMINAL (N/A)  SURGEON:  Surgeon(s) and Role:    * Tilda Burrow, MD - Primary  PHYSICIAN ASSISTANT:   ASSISTANTS: Wrenn, registered nurse  Details of procedure: Patient was taken to the operating room, prepped and draped for abdominal surgery. The lower abdominal transverse incision was made and the method of Pelosi, excising a 20 cm long ellipse of skin, by 6 cm wide, with underlying fatty tissues to allow for improved access. The midline vertical incision was made through the fashion, and then the peritoneal cavity entered bluntly, and the incision opened  surgically cephalad and caudad, bowel packed away using moist laparotomy tapes and a radiographic green towel, a Balfour retractors and bladder blade. The uterus was quite large, filled the pelvis and was relatively immobile due to short firm round ligaments bilaterally the round ligaments were clamped cut and suture ligated with 0 chromic suture on each side, and then the utero-ovarian ligament on the left side isolated doubly clamped with Heaney clamps transected and 0 chromics suture ligated the opposite side was treated similarly. The area was quite vascular. There was a large 5 cm myoma protruding into the right broad ligament, and identifying the uterine vessels on the right side was challenging. They were somewhat spread apart. The uterine vessels on the left side were first identified clamped cut and suture ligated using 2 curved Heaney clamps, and they Kelly clamps for backbleeding, with 0 chromic suture x2 used the right side was treated similarly just above the fibroid, and then the fibroid carefully peeled out of the broad ligament  and then beneath the fibroid staying close to the uterus we were able doubly clamped the uterine vessels transect them and ligated. Straight Heaney clamp was then used below on the upper portion of the cardinal ligaments clamping cutting and suture ligating. At this time the large fibroid uterine body could be  Amputated off of the lower uterine segment. This allowed for dramatically improved visibility and access to the pelvis. Bladder flap had been mobilized inferiorly. A right angle clamp could be passed through the cervix to identify the anterior cervicovaginal fornix and then a stab incision with into the vaginal apex anterior to the cervix performed and Coker clamp used to grasp the vaginal cuff. The cervix was then amputated off the lower uterine segment, and then Aldridge stitches placed at each lateral vaginal angle pulling the cuff into the lower cardinal ligaments with good tissue approximation using 0 chromic suture. The vaginal cuff was closed with a series of figure-of-eight sutures of 0 chromic. The cul-de-sac was quite short and small. The support of the cuff at the end of the procedure was considered satisfactory. Pelvis was irrigated and point cautery used as necessary between the sutures on the cuff and hemostasis considered to be good at the end of the procedure. The majority of the blood loss was during the dissection prior to amputating the uterine fundus off of the lower uterine segment. Pelvis was irrigated, pedicles inspected. The left ovary required oversewing with single stitch at the end of the utero-ovarian ligament, and at no time were we anywhere in the retroperitoneum. Ureters were considered well out of the surgical area though they were not directly visible during the procedure once hemostasis was complete,  the bladder flap edge was tacked over the cuff with a 2-0 chromic suture x1. Bladder dome was inspected prior to this confirmed as intact. Urine was clear without any blood. The  pedicles were inspected again after removal of laparotomy equipment and hemostasis considered good. The anterior peritoneum was closed with 2-0 chromic, then the fascia closed with continuous running 0 PDS began at each end of the incision sewing to the middle.  subcutaneous tissues were irrigated confirmed as hemostatic, and approximated using interrupted 20 plain sutures x5 sutures, with sufficient oozing noted that a J-P drain 10 mm diameter was in the depths of the incision and allowed to exit through a separate stab incision on the left side of the abdomen. This was sewn in 0 Prolene. The subcuticular 4-0 Vicryl skin closure completed the procedure sponge and needle counts correct estimated blood loss 600 cc condition to recovery in stable urine output adequate and clear.

## 2012-10-22 NOTE — Anesthesia Postprocedure Evaluation (Signed)
  Anesthesia Post-op Note  Patient: Sandra Benson  Procedure(s) Performed: Procedure(s): HYSTERECTOMY ABDOMINAL (N/A)  Patient Location: PACU  Anesthesia Type:General  Level of Consciousness: awake, oriented and patient cooperative  Airway and Oxygen Therapy: Patient Spontanous Breathing and Patient connected to face mask oxygen  Post-op Pain: mild  Post-op Assessment: Post-op Vital signs reviewed, Patient's Cardiovascular Status Stable, Respiratory Function Stable, Patent Airway, No signs of Nausea or vomiting and Pain level controlled  Post-op Vital Signs: Reviewed and stable  Complications: No apparent anesthesia complications

## 2012-10-22 NOTE — Anesthesia Procedure Notes (Signed)
Procedure Name: Intubation Date/Time: 10/22/2012 7:44 AM Performed by: Carolyne Littles, Aracelli Woloszyn L Pre-anesthesia Checklist: Patient identified, Patient being monitored, Timeout performed, Emergency Drugs available and Suction available Patient Re-evaluated:Patient Re-evaluated prior to inductionOxygen Delivery Method: Circle System Utilized Preoxygenation: Pre-oxygenation with 100% oxygen Intubation Type: IV induction Ventilation: Mask ventilation without difficulty Laryngoscope Size: 3 and Miller Grade View: Grade I Tube type: Oral Tube size: 7.0 mm Number of attempts: 1 Airway Equipment and Method: stylet Placement Confirmation: ETT inserted through vocal cords under direct vision,  positive ETCO2 and breath sounds checked- equal and bilateral Secured at: 21 cm Tube secured with: Tape Dental Injury: Teeth and Oropharynx as per pre-operative assessment

## 2012-10-22 NOTE — H&P (Signed)
Sandra Benson is an 47 y.o. female. She is admitted for Total abdominal hysterectomy with removal of cervix, for fibroid uterine enlargement, and subsequent heavy menses.  She is referred from the Health department and the ED [facility] for treatment. Her pap smear 7/24 is normal, endometrial biopsy is normal polypoid secretory endometrium. She has longstanding uterine fibroids by prior ultrasounds, with normal ovaries  Uterine size is 16 weeks.  Pertinent Gynecological History: Menses: flow is excessive with use of multiple pads or tampons on heaviest days Bleeding: no imb,  Contraception: condoms DES exposure: unknown Blood transfusions: none Sexually transmitted diseases: no past history with negative GC and Chlamydia and HPV on 09/19/12 Previous GYN Procedures: endometrial biopsy 7/14  benign  Last mammogram:  Date:  Last pap: normal Date: 09/19/12 OB History: G P   Menstrual History: Menarche age:  No LMP recorded.    Past Medical History  Diagnosis Date  . Fibroids     Past Surgical History  Procedure Laterality Date  . Tubal ligation      Family History  Problem Relation Age of Onset  . Diabetes Mother   . Diabetes Brother     Social History:  reports that she has never smoked. She does not have any smokeless tobacco history on file. She reports that she does not drink alcohol or use illicit drugs.  Allergies: No Known Allergies  No prescriptions prior to admission    ROS  There were no vitals taken for this visit. Physical Exam Physical Examination: General appearance - alert, well appearing, and in no distress and oriented to person, place, and time Mental status - alert, oriented to person, place, and time, normal mood, behavior, speech, dress, motor activity, and thought processes, affect appropriate to mood Eyes - pupils equal and reactive, extraocular eye movements intact Neck - supple, no significant adenopathy Heart - normal rate and regular  rhythm Abdomen - soft, nontender, nondistended, no masses or organomegaly 16 wk uterine size Pelvic - VULVA: normal appearing vulva with no masses, tenderness or lesions, VAGINA: normal appearing vagina with normal color and discharge, no lesions, CERVIX: normal appearing cervix without discharge or lesions, endocervical polyp 1 cm size was removed, proven as benign cm, , UTERUS: enlarged to 16 week's size, ADNEXA: normal adnexa in size, nontender and no masses Extremities - peripheral pulses normal, no pedal edema, no clubbing or cyanosis, Homan's sign negative bilaterally  No results found for this or any previous visit (from the past 24 hour(s)).  No results found.  Assessment/Plan: Uterine fibroids 16 weeks with heavy menses, for abdominal hysterectomy with removal of cervix. Mild anemia, type and crossmatch performed.  Iver Fehrenbach V 10/22/2012, 5:25 AM

## 2012-10-22 NOTE — Anesthesia Preprocedure Evaluation (Signed)
Anesthesia Evaluation  Patient identified by MRN, date of birth, ID band Patient awake    Reviewed: Allergy & Precautions, H&P , NPO status , Patient's Chart, lab work & pertinent test results  History of Anesthesia Complications Negative for: history of anesthetic complications  Airway Mallampati: III TM Distance: >3 FB Neck ROM: Full    Dental  (+) Edentulous Upper and Partial Lower   Pulmonary neg pulmonary ROS,  breath sounds clear to auscultation        Cardiovascular hypertension (borderline), Rhythm:Regular Rate:Normal     Neuro/Psych    GI/Hepatic negative GI ROS,   Endo/Other    Renal/GU      Musculoskeletal   Abdominal   Peds  Hematology   Anesthesia Other Findings   Reproductive/Obstetrics                           Anesthesia Physical Anesthesia Plan  ASA: II  Anesthesia Plan: General   Post-op Pain Management:    Induction: Intravenous  Airway Management Planned: Oral ETT  Additional Equipment:   Intra-op Plan:   Post-operative Plan: Extubation in OR  Informed Consent: I have reviewed the patients History and Physical, chart, labs and discussed the procedure including the risks, benefits and alternatives for the proposed anesthesia with the patient or authorized representative who has indicated his/her understanding and acceptance.     Plan Discussed with:   Anesthesia Plan Comments:         Anesthesia Quick Evaluation

## 2012-10-23 LAB — CBC
Platelets: 266 10*3/uL (ref 150–400)
RBC: 3.14 MIL/uL — ABNORMAL LOW (ref 3.87–5.11)
RDW: 13.8 % (ref 11.5–15.5)
WBC: 8.6 10*3/uL (ref 4.0–10.5)

## 2012-10-23 LAB — BASIC METABOLIC PANEL
CO2: 25 mEq/L (ref 19–32)
Calcium: 8.6 mg/dL (ref 8.4–10.5)
Chloride: 103 mEq/L (ref 96–112)
GFR calc Af Amer: >90 mL/min (ref >90)
Sodium: 135 mEq/L (ref 135–145)

## 2012-10-23 NOTE — Anesthesia Postprocedure Evaluation (Signed)
  Anesthesia Post-op Note  Patient: Sandra Benson  Procedure(s) Performed: Procedure(s): HYSTERECTOMY ABDOMINAL (N/A)  Patient Location: Room 312  Anesthesia Type:General  Level of Consciousness: awake, alert , oriented and patient cooperative  Airway and Oxygen Therapy: Patient Spontanous Breathing  Post-op Pain: mild  Post-op Assessment: Post-op Vital signs reviewed, Patient's Cardiovascular Status Stable, Respiratory Function Stable, Patent Airway, No signs of Nausea or vomiting, Adequate PO intake and Pain level controlled  Post-op Vital Signs: Reviewed and stable  Complications: No apparent anesthesia complications Nausea immediately post op;  Has resolved

## 2012-10-23 NOTE — Progress Notes (Signed)
UR chart review completed.  

## 2012-10-23 NOTE — Care Management Note (Addendum)
    Page 1 of 1   10/24/2012     12:07:08 PM   CARE MANAGEMENT NOTE 10/24/2012  Patient:  Sandra Benson, Sandra Benson   Account Number:  [id number]  Date Initiated:  10/23/2012  Documentation initiated by:  Sharrie Rothman  Subjective/Objective Assessment:   Pt admitted from home s/p abd hysterectomy. Pt lives with her daughter and will return home with daughter at discharge. Pt is independent with ADL's.     Action/Plan:   No CM needs noted.   Anticipated DC Date:  10/24/2012   Anticipated DC Plan:  HOME/SELF CARE      DC Planning Services  CM consult      Choice offered to / List presented to:             Status of service:  Completed, signed off Medicare Important Message given?   (If response is "NO", the following Medicare IM given date fields will be blank) Date Medicare IM given:   Date Additional Medicare IM given:    Discharge Disposition:  HOME/SELF CARE  Per UR Regulation:    If discussed at Long Length of Stay Meetings, dates discussed:    Comments:  10/24/12 1205 Arlyss Queen, RN BSN CM Pt discharged home today. No CM needs noted.  10/23/12 1137 Arlyss Queen, RN BSN CM

## 2012-10-23 NOTE — Progress Notes (Signed)
1 Day Post-Op Procedure(s) (LRB): HYSTERECTOMY ABDOMINAL (N/A)  Subjective: Patient reports incisional pain, tolerating PO and no problems voiding.    Objective: I have reviewed patient's vital signs, medications and labs. CBC Hemoglobin & Hematocrit     Component Value Date/Time   HGB 8.6* 10/23/2012 0549   HCT 26.8* 10/23/2012 0549     General: alert, cooperative and no distress Resp: clear to auscultation bilaterally GI: incision: clean, dry, intact and bowel sounds active Extremities: extremities normal, atraumatic, no cyanosis or edema and Homans sign is negative, no sign of DVT Patient has moderate swelling of the fingers due to rehydration Assessment: s/p Procedure(s): HYSTERECTOMY ABDOMINAL (N/A): stable  Plan: Advance diet Encourage ambulation Advance to PO medication Discontinue IV fluids foley discontinued  LOS: 1 day    Sandra Benson V 10/23/2012, 8:19 AM

## 2012-10-24 ENCOUNTER — Encounter (HOSPITAL_COMMUNITY): Payer: Self-pay | Admitting: Obstetrics and Gynecology

## 2012-10-24 MED ORDER — SODIUM CHLORIDE 0.9 % IJ SOLN
INTRAMUSCULAR | Status: AC
  Administered 2012-10-24: 3 mL
  Filled 2012-10-24: qty 3

## 2012-10-24 MED ORDER — OXYCODONE-ACETAMINOPHEN 5-325 MG PO TABS: 1.0000 | ORAL_TABLET | ORAL | Status: DC | PRN

## 2012-10-24 MED ORDER — IBUPROFEN 600 MG PO TABS: 600.0000 mg | ORAL_TABLET | Freq: Four times a day (QID) | ORAL | Status: DC | PRN

## 2012-10-24 MED ORDER — DSS 100 MG PO CAPS: 100.0000 mg | ORAL_CAPSULE | Freq: Two times a day (BID) | ORAL | Status: DC

## 2012-10-24 NOTE — Discharge Summary (Addendum)
Physician Discharge Summary  Patient ID: Sandra Benson MRN: [id number] DOB/AGE: 1965/12/28 47 y.o.  Admit date: 10/22/2012 Discharge date: 10/24/2012  Admission Diagnoses: Uterine fibroids 16 weeks, and anemia secondary to menorrhagia and  Discharge Diagnoses: Uterine fibroids 16 weeks, and anemia secondary to menorrhagia and adenomyosis   Active Problems:   * No active hospital problems. *   Discharged Condition: good  Hospital Course: *Sandra Benson is an 47 y.o. female. She is admitted for Total abdominal hysterectomy with removal of cervix, for fibroid uterine enlargement, and subsequent heavy menses. She is referred from the Health department and the ED [facility] for treatment. Her pap smear 7/24 is normal, endometrial biopsy is normal polypoid secretory endometrium. She has longstanding uterine fibroids by prior ultrasounds, with normal ovaries Uterine size is 16 weeks.  Hysterectomy was notable for fibroids in the right broad ligament requiring technically difficult surgery was 650 cc estimated blood loss  Consults: None  Significant Diagnostic Studies: labs:  Lab Results  Component Value Date   WBC 8.6 10/23/2012   HGB 8.6* 10/23/2012   HCT 26.8* 10/23/2012   MCV 85.4 10/23/2012   PLT 266 10/23/2012    Treatments: surgery: Abdominal hysterectomy with removal of cervix, wide excision of subcutaneous fat and skin  Discharge Exam: Blood pressure 116/58, pulse 72, temperature 98.9 F (37.2 C), temperature source Oral, resp. rate 18, height 5\' 1"  (1.549 m), weight 77.565 kg (171 lb), SpO2 100.00%. General appearance: alert, cooperative and no distress Resp: clear to auscultation bilaterally GI: soft, non-tender; bowel sounds normal; no masses,  no organomegaly and J-P drain in place effective clear drainage  Disposition: 01-Home or Self Care  Discharge Orders   Future Appointments Provider Department Dept Phone   11/11/2012 9:15 AM Tilda Burrow, MD [facility]  [phone]   Future Orders Complete By Expires   Diet - low sodium heart healthy  As directed    Discharge instructions  As directed    Comments:     General Gynecological Post-Operative Instructions You may expect to feel dizzy, weak, and drowsy for as long as 24 hours after receiving the medicine that made you sleep (anesthetic). The following information pertains to your recovery period for the first 24 hours following surgery.  Do not drive a car, ride a bicycle, participate in physical activities, or take public transportation until you are done taking narcotic pain medicines or as directed by your caregiver.  Do not drink alcohol or take tranquilizers.  Do not take medicine that has not been prescribed by your caregiver.  Do not sign important papers or make important decisions while on narcotic pain medicines.  Have a responsible person with you.  CARE OF INCISION  Keep incision clean and dry. Take showers instead of baths until your caregiver gives you permission to take baths. Check with your caregiver if you have tubes coming from the wound site. Empty the drain daily Avoid heavy lifting (more than 10 pounds/4.5 kilograms), pushing, or pulling.  Avoid activities that may risk injury to your surgical site.  Only take over-the-counter or prescription medicines for pain, discomfort, or fever as directed by your caregiver. Do not take aspirin. It can make you bleed. Take medicines (antibiotics) that kill germs as directed.  Call the office or go to the MAU if:  You feel sick to your stomach (nauseous).  You start to throw up (vomit).  You have trouble eating or drinking.  You have an oral temperature above 100.4.  You have  constipation that is not helped by adjusting diet or increasing fluid intake. Pain medicines are a common cause of constipation.  SEEK IMMEDIATE MEDICAL CARE IF:  You have persistent dizziness.  You have difficulty breathing or a congested sounding (croupy) cough.   You have an oral temperature above 102.5, not controlled by medicine.  There is increasing pain or tenderness near or in the surgical site.  ExitCare Patient Information 2011 [place], [place].   Driving Restrictions  As directed    Comments:     No driving x2 weeks   Increase activity slowly  As directed    No dressing needed  As directed    Sexual Activity Restrictions  As directed    Comments:     *None x6 weeks       Medication List         DSS 100 MG Caps  Take 100 mg by mouth 2 (two) times daily.     ibuprofen 600 MG tablet  Commonly known as:  ADVIL,MOTRIN  Take 1 tablet (600 mg total) by mouth every 6 (six) hours as needed for pain.     multivitamin with minerals Tabs tablet  Take 1 tablet by mouth daily.     oxyCODONE-acetaminophen 5-325 MG per tablet  Commonly known as:  PERCOCET/ROXICET  Take 1 tablet by mouth every 4 (four) hours as needed for pain.           Follow-up Information   Follow up with Tilda Burrow, MD In 5 days.   Specialties:  Obstetrics and Gynecology, Radiology   Contact information:   [address] [place] [address] [phone]       Signed: Tilda Burrow 10/24/2012, 11:34 AM  In response to the coding query listed below:        Deficiency Status Due   Coding Query Response Needed 10/26/2012 11:59 PM         Coding query message Reply    Type: None Subject: Physician query        Generic Coding Query Please update your documentation within the medical record to reflect your response to this query.  10/25/12  Dear Dr. Harley Alto,  In a better effort to capture your patient's severity of illness, reflect appropriate length of stay and utilization of resources, a review of the patient medical record has revealed the following indicators.   Based on your clinical judgment, please clarify and document in a progress note and/or discharge summary the clinical condition associated with the  following supporting information:   Hemoglobin on 10/22/12: 8.6.   Discharge summary: Uterine fibroids 16 weeks, and anemia secondary to menorrhagia and adenomyosis.    In responding to this query please exercise your independent judgment. The fact that a query is asked, does not imply that any particular answer is desired or expected.  Please clarify the type of anemia:   Blood loss anemia, acute   Blood loss anemia, chronic   Chronic anemia   Deficiency anemia (please specify type)   Dilutional   Due to/in/with antineoplastic chemotherapy   Due to/in/with chronic kidney disease   Due to/in/with end stage renal disease   Due to/in/with kidney failure   Due to/in/with neoplastic disease   Hemolytic anemia   Iron deficiency anemia   Macrocytic anemia   Megaloblastic anemia   Microcytic anemia   Normocytic anemia   Nutritional anemia   Pernicious anemia   Postoperative blood loss anemia   Refractory anemia   Sideroblastic anemia  Other anemia (please specify)____________________   Unable to determine   Unknown    You may use possible, probable, or suspect with inpatient documentation. possible, probable, suspected diagnoses MUST be documented at the time of discharge  Reviewed: additional documentation in the medical record  Reviewed: no additional documentation provided  Thank You,  Coder: Brooks Sailors  Health Information Management [facility]  THIS DOCUMENT IS A PERMANENT PART OF THE MEDICAL RECORD         Patient Information    Patient Name: Sandra, Benson Medical Record Number: [id number]   Contact Serial Number: [id number] Hospital Account: [id number]   Contact Type: Hospital Encounter Patient Class: Inpatient   Admission Date: 10/22/2012 0641 IP Admission Date: 10/22/2012 7829   Admission Source: Physician Or Clinic Referral Primary Service: Surgery   Discharge Date: 10/24/2012 1417 Discharge Disposition: 01-home Or Self Care       response to the following Query,:  The patient had chronic anemia at admission, and acute postop blood loss anemia at discharge.

## 2012-10-24 NOTE — Progress Notes (Signed)
10/24/12 1304 Patient stated some difficulty breathing while eating lunch, but went away after eating. Denies palpitations or chest discomfort. Notified Dr. Emelda Fear of patient c/o difficulty breathing at times and palpitations during night. Stated he was aware she has a cough. Notified him her lung sounds are clear and she was instructed to notify MD of any further difficulty breathing, palpitations, fever, etc at home. Stated patient still okay for discharge, f/u as instructed. Earnstine Regal, RN

## 2012-10-24 NOTE — Progress Notes (Signed)
10/24/12 1206 Patient c/o some difficulty breathing, dry cough during night, and chest discomfort with palpatations at times. Lung sounds clear, regular heart rate/rhythm. Discussed importance of coughing, deep breathing after surgery. Reviewed incentive spirometer education and correct technique for use with patient, daughter at bedside. Pt demonstrates correct use of incentive spirometer. Encouraged to use pillow as splint to abdominal area when coughing, moving, sneezing for comfort. Pt and daughter verbalize understanding. Instructed to notify MD of any persistent cough, fever, or further chest discomfort, difficulty breathing. States will do so. Denies chest discomfort, palpitations, or difficulty breathing at this time.  Earnstine Regal, RN

## 2012-10-24 NOTE — Progress Notes (Signed)
2 Days Post-Op Procedure(s) (LRB): HYSTERECTOMY ABDOMINAL (N/A)  Subjective: Patient reports incisional pain, tolerating PO and no problems voiding.   She also is noticing a cough overnight no fever. Objective: I have reviewed patient's vital signs, intake and output, medications, labs and pathology.\  Pathology shows adenomyosis uterine fibroids and a 594 g uterus General: alert, appears stated age and no distress Resp: Unlabored breathing GI: soft, non-tender; bowel sounds normal; no masses,  no organomegaly and incision: clean, dry and erythematous  Assessment: s/p Procedure(s): HYSTERECTOMY ABDOMINAL (N/A): stable, progressing well and Stable for discharge home  Plan: Advance diet Discharge home  LOS: 2 days    Sandra Benson V 10/24/2012, 11:26 AM

## 2012-10-24 NOTE — Progress Notes (Signed)
10/24/12 1314 Discharge instructions reviewed with patient this afternoon, given copy of instructions, medication list, prescriptions, f/u appointments as scheduled. Reviewed post-op education, handout in spanish given to patient. Daughter at bedside for discharge teaching. Pt and daughter verbalize understanding. IV site d/c'd and within normal limits. Notified patient and daughter that Dr. Emelda Fear states okay for discharge, f/u as instructed. Both are agreeable to discharge plan. Pt in stable condition awaiting discharge home. Earnstine Regal, RN

## 2012-10-24 NOTE — Progress Notes (Signed)
Endometrial Biopsy: Patient given informed consent, signed copy in the chart, time out was performed. Time out taken. . The patient was placed in the lithotomy position and the cervix brought into view with sterile speculum.  Portio of cervix cleansed x 2 with betadine swabs.  A tenaculum was placed in the anterior lip of the cervix. The uterus was sounded for depth of 11 cm,. Milex uterine Explora 3 mm was introduced to into the uterus, suction created,  and an endometrial sample was obtained. All equipment was removed and accounted for.   The patient tolerated the procedure well.    Patient given post procedure instructions.  Followup: 2 weeks preop final visit  Cervical biopsy,  Polypoid cervical lesion excised with biopsy insrument and sent as separate specimen

## 2012-10-25 DIAGNOSIS — Z029 Encounter for administrative examinations, unspecified: Secondary | ICD-10-CM

## 2012-10-25 NOTE — Progress Notes (Signed)
UR chart review completed.  

## 2012-10-28 LAB — TYPE AND SCREEN
Antibody Screen: NEGATIVE
Unit division: 0

## 2012-10-30 ENCOUNTER — Encounter: Payer: Self-pay | Admitting: Obstetrics and Gynecology

## 2012-10-30 ENCOUNTER — Ambulatory Visit (INDEPENDENT_AMBULATORY_CARE_PROVIDER_SITE_OTHER): Payer: Self-pay | Admitting: Obstetrics and Gynecology

## 2012-10-30 VITALS — BP 120/80 | Ht 61.0 in | Wt 166.0 lb

## 2012-10-30 DIAGNOSIS — G8918 Other acute postprocedural pain: Secondary | ICD-10-CM

## 2012-10-30 DIAGNOSIS — Z9889 Other specified postprocedural states: Secondary | ICD-10-CM

## 2012-10-30 NOTE — Progress Notes (Signed)
Patient ID: Sandra Benson, female   DOB: 11-20-1965, 47 y.o.   MRN: [id number] Pt here today for JP drain removal. NO postop complications No fever O JP drain removed w/o probs. A stable postop visit number 1    Drain removal P: rechk4 wk

## 2012-10-30 NOTE — Patient Instructions (Addendum)
Continue following the directions given at discharge.

## 2012-11-06 ENCOUNTER — Encounter: Payer: BC Managed Care – PPO | Admitting: Obstetrics and Gynecology

## 2012-11-11 ENCOUNTER — Encounter: Payer: BC Managed Care – PPO | Admitting: Obstetrics and Gynecology

## 2012-11-13 ENCOUNTER — Encounter: Payer: Self-pay | Admitting: Obstetrics and Gynecology

## 2012-11-27 ENCOUNTER — Ambulatory Visit (INDEPENDENT_AMBULATORY_CARE_PROVIDER_SITE_OTHER): Payer: BC Managed Care – PPO | Admitting: Obstetrics and Gynecology

## 2012-11-27 ENCOUNTER — Encounter: Payer: Self-pay | Admitting: Obstetrics and Gynecology

## 2012-11-27 VITALS — BP 120/80 | Ht 61.0 in | Wt 164.0 lb

## 2012-11-27 DIAGNOSIS — Z9889 Other specified postprocedural states: Secondary | ICD-10-CM

## 2012-11-27 DIAGNOSIS — R309 Painful micturition, unspecified: Secondary | ICD-10-CM

## 2012-11-27 LAB — POCT URINALYSIS DIPSTICK
Ketones, UA: NEGATIVE
Leukocytes, UA: NEGATIVE
Nitrite, UA: NEGATIVE
Protein, UA: NEGATIVE

## 2012-11-27 NOTE — Patient Instructions (Signed)
Return to work 13 Oct

## 2012-11-27 NOTE — Progress Notes (Signed)
Patient ID: Sandra Benson, female   DOB: [birth year], 47 y.o.   MRN: [id number] Pt here for final post op exam. Pt states that she still has pain sometimes in her incision, more on the right side. Pt denies any discharge or drainage. Pt states she has some pain with urination. Will check urine.   Urine dip was negative.  Subjective:     Sandra Benson is a 47 y.o. female who presents to the clinic 6 weeks status post total abdominal hysterectomy for fibroids. Diet:       regular without difficulty. Bowel function is: normal. Pain:     The patient is not having any pain.  The incision is still a bit firm, only above the scar due to lymphatic blockage  The following portions of the patient's history were reviewed and updated as appropriate: allergies, current medications, past family history, past medical history, past social history, past surgical history and problem list.  Review of Systems Pertinent items are noted in HPI.    Objective:    BP 120/80  Ht 5\' 1"  (1.549 m)  Wt 164 lb (74.39 kg)  BMI 31 kg/m2  LMP 10/01/2012 General:  alert  Abdomen: soft, bowel sounds active, non-tender  Incision:   healing well, no drainage, no erythema, no hernia, no seroma, no swelling, well approximated, no dehiscence, incision well approximated       Pelvic: cuff closed    Assessment:    Doing well postoperatively. Operative findings again reviewed. Pathology report discussed.    Plan:    1. Continue any current medications. 2. Wound care discussed. 3. Activity restrictions: none 4. Anticipated return to work: 1-2 weeks.13 Oct 5. Follow up: yr yr for  As needed.

## 2013-01-02 ENCOUNTER — Other Ambulatory Visit: Payer: Self-pay

## 2013-09-29 ENCOUNTER — Ambulatory Visit (HOSPITAL_COMMUNITY)
Admission: RE | Admit: 2013-09-29 | Discharge: 2013-09-29 | Disposition: A | Discharge status: Home / Self Care | Payer: BC Managed Care – PPO | Source: Ambulatory Visit | Attending: Family Medicine | Admitting: Family Medicine

## 2013-09-29 DIAGNOSIS — I10 Essential (primary) hypertension: Secondary | ICD-10-CM | POA: Insufficient documentation

## 2013-09-29 DIAGNOSIS — I517 Cardiomegaly: Secondary | ICD-10-CM

## 2013-09-29 DIAGNOSIS — R0601 Orthopnea: Secondary | ICD-10-CM | POA: Insufficient documentation

## 2013-09-29 DIAGNOSIS — E119 Type 2 diabetes mellitus without complications: Secondary | ICD-10-CM | POA: Insufficient documentation

## 2013-09-29 DIAGNOSIS — R609 Edema, unspecified: Secondary | ICD-10-CM | POA: Insufficient documentation

## 2013-09-29 NOTE — Progress Notes (Signed)
  Echocardiogram 2D Echocardiogram has been performed.  She[place], [place] 09/29/2013, 2:53 PM

## 2013-10-06 ENCOUNTER — Other Ambulatory Visit (HOSPITAL_COMMUNITY): Payer: Self-pay | Admitting: Physician Assistant

## 2013-10-06 DIAGNOSIS — Z1231 Encounter for screening mammogram for malignant neoplasm of breast: Secondary | ICD-10-CM

## 2013-10-20 ENCOUNTER — Ambulatory Visit (HOSPITAL_COMMUNITY)
Admission: RE | Admit: 2013-10-20 | Discharge: 2013-10-20 | Disposition: A | Discharge status: Home / Self Care | Payer: Self-pay | Source: Ambulatory Visit | Attending: Physician Assistant | Admitting: Physician Assistant

## 2013-10-20 DIAGNOSIS — Z1231 Encounter for screening mammogram for malignant neoplasm of breast: Secondary | ICD-10-CM

## 2013-11-05 ENCOUNTER — Encounter: Payer: Self-pay | Admitting: Cardiology

## 2013-11-05 ENCOUNTER — Ambulatory Visit (INDEPENDENT_AMBULATORY_CARE_PROVIDER_SITE_OTHER): Payer: BC Managed Care – PPO | Admitting: Cardiology

## 2013-11-05 VITALS — BP 142/96 | HR 54 | Ht 60.0 in | Wt 176.0 lb

## 2013-11-05 DIAGNOSIS — R0789 Other chest pain: Secondary | ICD-10-CM

## 2013-11-05 DIAGNOSIS — R079 Chest pain, unspecified: Secondary | ICD-10-CM | POA: Insufficient documentation

## 2013-11-05 DIAGNOSIS — R072 Precordial pain: Secondary | ICD-10-CM

## 2013-11-05 MED ORDER — AMLODIPINE BESYLATE 5 MG PO TABS: 5.0000 mg | ORAL_TABLET | Freq: Every day | ORAL | Status: DC

## 2013-11-05 NOTE — Patient Instructions (Signed)
Your physician recommends that you schedule a follow-up appointment in: 3-4 weeks   Your physician has recommended you make the following change in your medication:    STOP Metoprolol  START Norvasc 5 mg daily  START Aspirin 81 mg daily   Your physician has requested that you have an exercise tolerance test. For further information please visit [email or web address]. Please also follow instruction sheet, as given.       Thank you for choosing [facility] !

## 2013-11-05 NOTE — Progress Notes (Signed)
Clinical Summary Ms. Pautler is a 48 y.o.female seen today as a new patient for the following medical problems. She is spanish speaking, history is taken with assistance of spanish translator  1. Chest pain - symptoms started approx few months ago. Feeling of stabbing or pressure like feeling in chest, 6/10. Felt in left chest. Can occur at rest or with exertion. +SOB +diaphoresis, can have palpitations which can sometimes be severe. Can sometimes be worst movement. Pain lasts approx 5 minutes. Occurs less often since starting metoprolol, on average approx daily - increased DOE with activities. +Swelling in legs. +orthopnea - normal echo 09/2013 - started on metoprolol by pcp, dose cut back due to significant fatigue. Can have some lightheadedness/dizziness. Better with lower dose.  CAD risk factors: no tobacco, mother with "heart troubles" middle age, brother had MI 24.     Past Medical History  Diagnosis Date  . Fibroids      No Known Allergies   Current Outpatient Prescriptions  Medication Sig Dispense Refill  . docusate sodium 100 MG CAPS Take 100 mg by mouth 2 (two) times daily.  10 capsule  0  . ibuprofen (ADVIL,MOTRIN) 600 MG tablet Take 1 tablet (600 mg total) by mouth every 6 (six) hours as needed for pain.  30 tablet  1  . Multiple Vitamin (MULTIVITAMIN WITH MINERALS) TABS tablet Take 1 tablet by mouth daily.      Marland Kitchen oxyCODONE-acetaminophen (PERCOCET/ROXICET) 5-325 MG per tablet Take 1 tablet by mouth every 4 (four) hours as needed for pain.  20 tablet  0   No current facility-administered medications for this visit.     Past Surgical History  Procedure Laterality Date  . Tubal ligation    . Abdominal hysterectomy N/A 10/22/2012    Procedure: HYSTERECTOMY ABDOMINAL;  Surgeon: Jonnie Kind, MD;  Location: [facility];  Service: Gynecology;  Laterality: N/A;     No Known Allergies    Family History  Problem Relation Age of Onset  . Diabetes Mother   .  Diabetes Brother      Social History Ms. Chavis reports that she has never smoked. She has never used smokeless tobacco. Ms. Megill reports that she does not drink alcohol.   Review of Systems CONSTITUTIONAL: No weight loss, fever, chills, weakness or fatigue.  HEENT: Eyes: No visual loss, blurred vision, double vision or yellow sclerae.No hearing loss, sneezing, congestion, runny nose or sore throat.  SKIN: No rash or itching.  CARDIOVASCULAR: per HPI RESPIRATORY: No shortness of breath, cough or sputum.  GASTROINTESTINAL: No anorexia, nausea, vomiting or diarrhea. No abdominal pain or blood.  GENITOURINARY: No burning on urination, no polyuria NEUROLOGICAL: No headache, dizziness, syncope, paralysis, ataxia, numbness or tingling in the extremities. No change in bowel or bladder control.  MUSCULOSKELETAL: No muscle, back pain, joint pain or stiffness.  LYMPHATICS: No enlarged nodes. No history of splenectomy.  PSYCHIATRIC: No history of depression or anxiety.  ENDOCRINOLOGIC: No reports of sweating, cold or heat intolerance. No polyuria or polydipsia.  Marland Kitchen   Physical Examination p 54 bp 142/96 Wt 176 lbs BMI 34 Gen: resting comfortably, no acute distress HEENT: no scleral icterus, pupils equal round and reactive, no palptable cervical adenopathy,  CV: regular, rate 55, no m/r/g, no JVD Resp: Clear to auscultation bilaterally GI: abdomen is soft, non-tender, non-distended, normal bowel sounds, no hepatosplenomegaly MSK: extremities are warm, no edema.  Skin: warm, no rash Neuro:  no focal deficits Psych: appropriate affect   Diagnostic  Studies 09/2013 Echo Study Conclusions  - Procedure narrative: Transthoracic echocardiography. Image quality was suboptimal. The study was technically difficult, as a result of body habitus. - Left ventricle: The cavity size was normal. Wall thickness was increased in a pattern of mild LVH. Systolic function was normal. The estimated ejection  fraction was in the range of 60% to 65%. Left ventricular diastolic function parameters were normal.  Impressions:  - Normal biventricular systolic function and normal left ventricular diastolic function, with no significant valvular pathology.    EKG Sinus brady, low voltage, no ischemic changes  Assessment and Plan  1.  Chest pain - unclear etiology, she does have CAD risk factors including a brother with MI at early age - normal echo, no baseline ischemic EKG changes - will obtain GXT  - symptoms improved with metoprolol, but EKG shows sinus brady rates in 50s and she complains of some dizziness. Will start norvasc as alternative antianginal. Start ASA 81mg  daily  F/u 3-4 weeks       Arnoldo Lenis, M.D., F.A.C.C.

## 2013-11-13 ENCOUNTER — Encounter (HOSPITAL_COMMUNITY): Payer: Self-pay

## 2013-11-26 ENCOUNTER — Ambulatory Visit: Payer: Self-pay | Admitting: Cardiology

## 2013-12-17 ENCOUNTER — Ambulatory Visit (HOSPITAL_COMMUNITY)
Admission: RE | Admit: 2013-12-17 | Discharge: 2013-12-17 | Disposition: A | Discharge status: Home / Self Care | Payer: Self-pay | Source: Ambulatory Visit | Attending: Physician Assistant | Admitting: Physician Assistant

## 2013-12-19 ENCOUNTER — Encounter: Payer: Self-pay | Admitting: *Deleted

## 2013-12-29 ENCOUNTER — Encounter: Payer: Self-pay | Admitting: Cardiology

## 2014-02-24 ENCOUNTER — Encounter: Payer: Self-pay | Admitting: *Deleted

## 2014-12-17 ENCOUNTER — Encounter: Payer: Self-pay | Admitting: Internal Medicine

## 2014-12-17 ENCOUNTER — Ambulatory Visit: Payer: Self-pay | Attending: Internal Medicine | Admitting: Internal Medicine

## 2014-12-17 VITALS — BP 153/87 | HR 76 | Temp 98.3°F | Resp 16 | Wt 182.0 lb

## 2014-12-17 DIAGNOSIS — I10 Essential (primary) hypertension: Secondary | ICD-10-CM | POA: Insufficient documentation

## 2014-12-17 DIAGNOSIS — Z23 Encounter for immunization: Secondary | ICD-10-CM | POA: Insufficient documentation

## 2014-12-17 DIAGNOSIS — Z6834 Body mass index (BMI) 34.0-34.9, adult: Secondary | ICD-10-CM | POA: Insufficient documentation

## 2014-12-17 DIAGNOSIS — Z0000 Encounter for general adult medical examination without abnormal findings: Secondary | ICD-10-CM

## 2014-12-17 DIAGNOSIS — R609 Edema, unspecified: Secondary | ICD-10-CM | POA: Insufficient documentation

## 2014-12-17 DIAGNOSIS — Z833 Family history of diabetes mellitus: Secondary | ICD-10-CM | POA: Insufficient documentation

## 2014-12-17 DIAGNOSIS — Z79899 Other long term (current) drug therapy: Secondary | ICD-10-CM | POA: Insufficient documentation

## 2014-12-17 DIAGNOSIS — E669 Obesity, unspecified: Secondary | ICD-10-CM | POA: Insufficient documentation

## 2014-12-17 DIAGNOSIS — Z7982 Long term (current) use of aspirin: Secondary | ICD-10-CM | POA: Insufficient documentation

## 2014-12-17 LAB — POCT GLYCOSYLATED HEMOGLOBIN (HGB A1C): Hemoglobin A1C: 6.1

## 2014-12-17 MED ORDER — HYDROCHLOROTHIAZIDE 12.5 MG PO CAPS: 12.5000 mg | ORAL_CAPSULE | Freq: Every day | ORAL | Status: DC

## 2014-12-17 NOTE — Progress Notes (Signed)
Patient here to establish care Patient is concerned about her recent weight gain  Did run a A1C because patient blood sugars have been elevated in the past

## 2014-12-17 NOTE — Progress Notes (Signed)
Patient ID: Sandra Benson, female   DOB: 1965/05/16, 49 y.o.   MRN: [id number]  VZC:[id number]  [id number]:[id number]  DOB - April 15, 1965  CC:  Chief Complaint  Patient presents with  . New Patient (Initial Visit)       HPI: Sandra Benson is a 49 y.o. female here today to establish medical care.  Patient has a past medical history of HTN, fibroids, and obesity. Patient is not on any blood pressure medications for over one year. She states that she did not feel like her blood pressure medication was helping her with edema in her feet so she stopped taking the medication. States that she has a family history of diabetes and has had some elevated blood sugars in the past.  Patient reports that she was seen by a PCP in [place] 2 years ago for breast pain and was sent for a mammogram which came back normal. She reports that she occasionally still has that same sharp stabbing pain in her left breast. No lumps or nipple discharge.   Patient has No headache, No chest pain, No abdominal pain - No Nausea, No new weakness tingling or numbness, No Cough - SOB.  No Known Allergies Past Medical History  Diagnosis Date  . Fibroids    Current Outpatient Prescriptions on File Prior to Visit  Medication Sig Dispense Refill  . amLODipine (NORVASC) 5 MG tablet Take 1 tablet (5 mg total) by mouth daily. (Patient not taking: Reported on 12/17/2014) 90 tablet 3  . aspirin 81 MG tablet Take 81 mg by mouth daily.    . Multiple Vitamin (MULTIVITAMIN WITH MINERALS) TABS tablet Take 1 tablet by mouth daily.     No current facility-administered medications on file prior to visit.   Family History  Problem Relation Age of Onset  . Diabetes Mother   . Diabetes Brother    Social History   Social History  . Marital Status: Married    Spouse Name: N/A  . Number of Children: N/A  . Years of Education: N/A   Occupational History  . Not on file.   Social History Main Topics  . Smoking status: Never Smoker   .  Smokeless tobacco: Never Used  . Alcohol Use: No  . Drug Use: No  . Sexual Activity: Yes    Birth Control/ Protection: Surgical   Other Topics Concern  . Not on file   Social History Narrative    Review of Systems: Other than what is stated in HPI, all other systems are negative.   Objective:   Filed Vitals:   12/17/14 1424  BP: 153/87  Pulse: 76  Temp: 98.3 F (36.8 C)  Resp: 16    Physical Exam  Constitutional: She is oriented to person, place, and time.  Neck: Normal range of motion. No thyromegaly present.  Cardiovascular: Normal rate, regular rhythm and normal heart sounds.   Pulmonary/Chest: Effort normal and breath sounds normal.  Musculoskeletal: She exhibits edema (trace bilaterally).  Neurological: She is alert and oriented to person, place, and time.  Skin: Skin is warm and dry.  Psychiatric: She has a normal mood and affect.     Lab Results  Component Value Date   WBC 8.6 10/23/2012   HGB 8.6* 10/23/2012   HCT 26.8* 10/23/2012   MCV 85.4 10/23/2012   PLT 266 10/23/2012   Lab Results  Component Value Date   CREATININE 0.61 10/23/2012   BUN 4* 10/23/2012   NA 135 10/23/2012   K 4.0 10/23/2012  CL 103 10/23/2012   CO2 25 10/23/2012    Lab Results  Component Value Date   HGBA1C 6.10 12/17/2014   Lipid Panel  No results found for: CHOL, TRIG, HDL, CHOLHDL, VLDL, LDLCALC     Assessment and plan:   Sandra Benson was seen today for new patient (initial visit).  Diagnoses and all orders for this visit:  Essential hypertension -     CBC with Differential; Future -     Basic Metabolic Panel; Future -     TSH; Future -     Lipid panel; Future -    Begin hydrochlorothiazide (MICROZIDE) 12.5 MG capsule; Take 1 capsule (12.5 mg total) by mouth daily. I will change medication to HCTZ to help with edema. Will bring her back in 2 weeks for a BP recheck.   Preventative health care -     HgB A1c -     Flu Vaccine QUAD 36+ mos PF IM (Fluarix & Fluzone  Quad PF)   Due to language barrier, an interpreter was present during the history-taking and subsequent discussion (and for part of the physical exam) with this patient.   Return in about 1 week (around 12/24/2014) for Lab Visit and 3 mo PCP .       Lance Bosch, [facility] [phone] 12/17/2014, 2:33 PM

## 2014-12-17 NOTE — Patient Instructions (Signed)
Plan de alimentacin DASH (DASH Eating Plan) DASH es la sigla en ingls de "Enfoques Alimentarios para Detener la Hipertensin". El plan de alimentacin DASH ha demostrado bajar la presin arterial elevada (hipertensin). Los beneficios adicionales para la salud pueden incluir la disminucin del riesgo de diabetes mellitus tipo2, enfermedades cardacas e ictus. Este plan tambin puede ayudar a Horticulturist, commercial. QU DEBO SABER ACERCA DEL PLAN DE ALIMENTACIN DASH? Para el plan de alimentacin DASH, seguir las siguientes pautas generales:  Elija los alimentos con un valor porcentual diario de sodio de menos del 5% (segn figura en la etiqueta del alimento).  Use hierbas o aderezos sin sal, en lugar de sal de mesa o sal marina.  Consulte al mdico o farmacutico antes de usar sustitutos de la sal.  Coma productos con bajo contenido de sodio, cuya etiqueta suele decir "bajo contenido de sodio" o "sin agregado de sal".  Coma alimentos frescos.  Coma ms verduras, frutas y productos lcteos con bajo contenido de [place].  Elija los cereales integrales. Busque la palabra "integral" en Equities trader de la lista de ingredientes.  Elija el pescado y el pollo o el pavo sin piel ms a menudo que las carnes rojas. Limite el consumo de pescado, carne de ave y carne a 6onzas (170g) por Training and development officer.  Limite el consumo de dulces, postres, azcares y bebidas azucaradas.  Elija las grasas saludables para el corazn.  Limite el consumo de queso a 1onza (28g) por Training and development officer.  Consuma ms comida casera y menos de restaurante, de buf y comida rpida.  Limite el consumo de alimentos fritos.  Cocine los alimentos utilizando mtodos que no sean la fritura.  Limite las verduras enlatadas. Si las consume, enjuguelas bien para disminuir el sodio.  Cuando coma en un restaurante, pida que preparen su comida con menos sal o, en lo posible, sin nada de sal. QU ALIMENTOS PUEDO COMER? Pida ayuda a un nutricionista para  conocer las necesidades calricas individuales. Cereales Pan de salvado o integral. Arroz integral. Pastas de salvado o integrales. Quinua, trigo burgol y cereales integrales. Cereales con bajo contenido de sodio. Tortillas de harina de maz o de salvado. Pan de maz integral. Galletas saladas integrales. Galletas con bajo contenido de [place]. Vegetales Verduras frescas o congeladas (crudas, al vapor, asadas o grilladas). Jugos de tomate y verduras con contenido bajo o reducido de sodio. Pasta y salsa de tomate con contenido bajo o [place]. Verduras enlatadas con bajo contenido de sodio o reducido de sodio.  Lambert Mody Lambert Mody frescas, en conserva (en su jugo natural) o frutas congeladas. Carnes y otros productos con protenas Carne de res molida (al 85% o ms [place]), carne de res de animales alimentados con pastos o carne de res sin la grasa. Pollo o pavo sin piel. Carne de pollo o de [place]. Cerdo sin la grasa. Todos los pescados y frutos de mar. Huevos. Porotos, guisantes o lentejas secos. Frutos secos y semillas sin sal. Frijoles enlatados sin sal. Lcteos Productos lcteos con bajo contenido de grasas, como [place] o al 1%, quesos reducidos en grasas o al 2%, ricota con bajo contenido de grasas o [employer], o yogur natural con bajo contenido de [place]. Quesos con contenido bajo o reducido de sodio. Grasas y Naval architect en barra que no contengan grasas trans. Mayonesa y alios para ensaladas livianos o reducidos en grasas (reducidos en sodio). [place]. Aceites de crtamo, oliva o canola. Mantequilla natural de man o almendra. Otros Palomitas de maz y pretzels sin sal.  Mayones[place] y aliños para ensaladas livianos o reducidos en grasas (reducidos en sodio). [place]. Aceites de cártamo, oliva o canola. Mantequilla natural de maní o almendra.  Otros  Palomitas de maíz y pretzels sin sal.  Los artículos mencionados arriba pueden no ser una lista completa de las bebidas o los alimentos recomendados. Comuníquese con el nutricionista para conocer más opciones.  ¿QUÉ ALIMENTOS NO SE RECOMIENDAN?  Cereales  Pan blanco. Pastas blancas. Arroz blanco. Pan de maíz refinado. Bagels y  croissants. Galletas saladas que contengan grasas trans.  Vegetales  Vegetales con crema o fritos. Verduras en salsa de queso. Verduras enlatadas comunes. Pasta y salsa de tomate en lata comunes. Jugos comunes de tomate y de verduras.  Frutas  Frutas secas. Fruta enlatada en almíbar liviano o espeso. Jugo de frutas.  Carnes y otros productos con proteínas  Cortes de carne con grasa. Costillas, alas de pollo, tocineta, salchicha, mortadela, salame, chinchulines, tocino, perros calientes, salchichas alemanas y embutidos envasados. Frutos secos y semillas con sal. Frijoles con sal en lata.  Lácteos  Leche entera o al 2 %, crema, mezcla de leche y crema, y queso crema. Yogur entero o endulzado. Quesos o queso azul con alto contenido de grasas. Cremas no lácteas y coberturas batidas. Quesos procesados, quesos para untar o cuajadas.  Condimentos  Sal de cebolla y ajo, sal condimentada, sal de mesa y sal marina. Salsas en lata y envasadas. Salsa [place]. Salsa tártara. Salsa barbacoa. Salsa teriyaki. Salsa de soja, incluso la que tiene contenido reducido de sodio. Salsa de carne. Salsa de pescado. Salsa de ostras. Salsa rosada. Rábano picante. Ketchup y mostaza. Saborizantes y tiernizantes para carne. Caldo en cubitos. Salsa picante. Salsa tabasco. Adobos. Aderezos para tacos. Salsas.  Grasas y aceites  Mantequilla, margarina en barra, manteca de cerdo, grasa, mantequilla clarificada y grasa de tocino. Aceites de coco, de palmiste o de palma. Aderezos comunes para ensalada.  Otros  Pickles y aceitunas. Palomitas de maíz y pretzels con sal.  Los artículos mencionados arriba pueden no ser una lista completa de las bebidas y los alimentos que se deben evitar. Comuníquese con el nutricionista para obtener más información.  ¿DÓNDE PUEDO ENCONTRAR MÁS INFORMACIÓN?  Instituto Nacional del Corazón, del Pulmón y de la Sangre (National Heart, Lung, and Blood Institute): www.nhlbi.nih.gov/health/health-topics/topics/dash/      Esta información no tiene como fin reemplazar el consejo del médico. Asegúrese de hacerle al médico cualquier pregunta que tenga.     Document Released: 02/02/2011 Document Revised: 03/06/2014  Elsevier Interactive Patient Education ©2016 Elsevier Inc.

## 2014-12-24 ENCOUNTER — Ambulatory Visit: Payer: Self-pay | Attending: Family Medicine

## 2014-12-24 ENCOUNTER — Ambulatory Visit (HOSPITAL_BASED_OUTPATIENT_CLINIC_OR_DEPARTMENT_OTHER): Payer: Self-pay | Admitting: Pharmacist

## 2014-12-24 VITALS — BP 135/84 | HR 61

## 2014-12-24 DIAGNOSIS — Z79899 Other long term (current) drug therapy: Secondary | ICD-10-CM | POA: Insufficient documentation

## 2014-12-24 DIAGNOSIS — I10 Essential (primary) hypertension: Secondary | ICD-10-CM

## 2014-12-24 LAB — CBC WITH DIFFERENTIAL/PLATELET
BASOS ABS: 0 10*3/uL (ref 0.0–0.1)
Basophils Relative: 0 % (ref 0–1)
EOS ABS: 0.2 10*3/uL (ref 0.0–0.7)
EOS PCT: 2 % (ref 0–5)
HCT: 38.7 % (ref 36.0–46.0)
Hemoglobin: 12.9 g/dL (ref 12.0–15.0)
LYMPHS ABS: 2.4 10*3/uL (ref 0.7–4.0)
Lymphocytes Relative: 27 % (ref 12–46)
MCH: 30.1 pg (ref 26.0–34.0)
MCHC: 33.3 g/dL (ref 30.0–36.0)
MCV: 90.2 fL (ref 78.0–100.0)
MONO ABS: 0.5 10*3/uL (ref 0.1–1.0)
MPV: 10.7 fL (ref 8.6–12.4)
Monocytes Relative: 6 % (ref 3–12)
Neutro Abs: 5.7 10*3/uL (ref 1.7–7.7)
Neutrophils Relative %: 65 % (ref 43–77)
PLATELETS: 265 10*3/uL (ref 150–400)
RBC: 4.29 MIL/uL (ref 3.87–5.11)
RDW: 13 % (ref 11.5–15.5)
WBC: 8.8 10*3/uL (ref 4.0–10.5)

## 2014-12-24 LAB — BASIC METABOLIC PANEL
BUN: 10 mg/dL (ref 7–25)
CHLORIDE: 100 mmol/L (ref 98–110)
CO2: 28 mmol/L (ref 20–31)
CREATININE: 0.65 mg/dL (ref 0.50–1.05)
Calcium: 9.2 mg/dL (ref 8.6–10.4)
Glucose, Bld: 103 mg/dL — ABNORMAL HIGH (ref 65–99)
Potassium: 4.6 mmol/L (ref 3.5–5.3)
Sodium: 136 mmol/L (ref 135–146)

## 2014-12-24 LAB — LIPID PANEL
CHOLESTEROL: 169 mg/dL (ref 125–200)
HDL: 45 mg/dL — ABNORMAL LOW (ref >=46)
LDL Cholesterol: 78 mg/dL (ref <130)
TRIGLYCERIDES: 232 mg/dL — AB (ref <150)
Total CHOL/HDL Ratio: 3.8 Ratio (ref <=5.0)
VLDL: 46 mg/dL — AB (ref <30)

## 2014-12-24 LAB — TSH: TSH: 3.65 u[IU]/mL (ref 0.350–4.500)

## 2014-12-24 NOTE — Patient Instructions (Signed)
Thank you for coming in to see me  Your blood pressure is good - continue the hydrochlorothiazide   Follow up with Mateo Flow for the leg swelling and chest pain

## 2014-12-24 NOTE — Progress Notes (Signed)
S:    Patient arrives in good spirits. Presents to the clinic for hypertension evaluation. Patient is Spanish-speaking so a Engineer, site was used for the visit Lenna Sciara [phone]).  Patient reports adherence with medications.  Current BP Medications include:  Hydrochlorothiazide 12.5 mg daily.   Antihypertensives tried in the past include: amlodipine  Patient still has leg swelling. She reports that it did not improve after stopping the amlodipine.   She also reports some chest tightness and breast pain. She endorses pain in both breasts and nipples. She is not sure of the cause and reports that it has been going on for a while. She reports that she has had a recent mammogram and it was normal.    O:   Last 3 Office BP readings: BP Readings from Last 3 Encounters:  12/24/14 135/84  12/17/14 153/87  11/05/13 142/96    BMET    Component Value Date/Time   NA 135 10/23/2012 0549   K 4.0 10/23/2012 0549   CL 103 10/23/2012 0549   CO2 25 10/23/2012 0549   GLUCOSE 124* 10/23/2012 0549   BUN 4* 10/23/2012 0549   CREATININE 0.61 10/23/2012 0549   CALCIUM 8.6 10/23/2012 0549   GFRNONAA >90 10/23/2012 0549   GFRAA >90 10/23/2012 0549   3+ pitting edema  A/P: History of hypertension currently controlled on current medications. Continue hydrochlorothiazide 12.5 mg daily. Patient verbalized understanding.  Leg swelling unlikely due to amlodipine as it did not resolve after discontinuation. Upon further chart review, she has had these issues (edema and chest pain) in the past and was following with cardiology - there have been no changes or development of warning signs or symptoms of MI. ECHO had normal EF at that time. However, she has not followed up with them since 2015. Mammogram was normal in October 2015. I want her to follow up with Chari Manning and then possibly see Dr. Verl Blalock in our cardiology clinic here for further management if she is unable to follow with [facility] due to  finances.   Results reviewed and written information provided.   Total time in face-to-face counseling 20 minutes.   F/U Clinic Visit with Chari Manning, NP.

## 2014-12-25 ENCOUNTER — Ambulatory Visit: Payer: Self-pay | Attending: Internal Medicine

## 2014-12-31 ENCOUNTER — Ambulatory Visit: Payer: Self-pay | Attending: Internal Medicine | Admitting: Internal Medicine

## 2014-12-31 ENCOUNTER — Encounter: Payer: Self-pay | Admitting: Internal Medicine

## 2014-12-31 VITALS — BP 149/81 | HR 71 | Temp 98.0°F | Resp 16 | Ht 61.0 in | Wt 182.8 lb

## 2014-12-31 DIAGNOSIS — I10 Essential (primary) hypertension: Secondary | ICD-10-CM

## 2014-12-31 DIAGNOSIS — E781 Pure hyperglyceridemia: Secondary | ICD-10-CM

## 2014-12-31 MED ORDER — AMLODIPINE BESYLATE 5 MG PO TABS: 5.0000 mg | ORAL_TABLET | Freq: Every day | ORAL | Status: AC

## 2014-12-31 MED ORDER — OMEGA-3-ACID ETHYL ESTERS 1 G PO CAPS: 2.0000 g | ORAL_CAPSULE | Freq: Two times a day (BID) | ORAL | Status: DC

## 2014-12-31 NOTE — Progress Notes (Signed)
Patient ID: Sandra Benson, female   DOB: 02/26/1966, 49 y.o.   MRN: [id number]  CC: HTN f/u  HPI: Sandra Benson is a 49 y.o. female here today for a follow up visit.  Patient has past medical history of HTN. Patient was seen by me 2 weeks ago for hypertension and lower extremity edema. At that time she was discontinued off amlodipine and switched to HCTZ to help pull fluid off. She is back today with complaints of headaches since beginning HCTZ. Patient now reports that she has never taken Amlodipine and was not on any blood pressure medication for over one year before her last visit two weeks ago. She states that she plans to follow up with Cardiology very soon to discuss some occasional chest pain.    No Known Allergies Past Medical History  Diagnosis Date  . Fibroids    Current Outpatient Prescriptions on File Prior to Visit  Medication Sig Dispense Refill  . aspirin 81 MG tablet Take 81 mg by mouth daily.    . hydrochlorothiazide (MICROZIDE) 12.5 MG capsule Take 1 capsule (12.5 mg total) by mouth daily. 30 capsule 3  . Multiple Vitamin (MULTIVITAMIN WITH MINERALS) TABS tablet Take 1 tablet by mouth daily.     No current facility-administered medications on file prior to visit.   Family History  Problem Relation Age of Onset  . Diabetes Mother   . Diabetes Brother    Social History   Social History  . Marital Status: Married    Spouse Name: N/A  . Number of Children: N/A  . Years of Education: N/A   Occupational History  . Not on file.   Social History Main Topics  . Smoking status: Never Smoker   . Smokeless tobacco: Never Used  . Alcohol Use: No  . Drug Use: No  . Sexual Activity: Yes    Birth Control/ Protection: Surgical   Other Topics Concern  . Not on file   Social History Narrative    Review of Systems: Other than what is stated in HPI, all other systems are negative.   Objective:   Filed Vitals:   12/31/14 1516  BP: 149/81  Pulse: 71  Temp: 98 F (36.7  C)  Resp: 16    Physical Exam  Constitutional: She is oriented to person, place, and time.  Cardiovascular: Normal rate, regular rhythm and normal heart sounds.   Pulmonary/Chest: Effort normal and breath sounds normal.  Musculoskeletal: She exhibits no edema.  Neurological: She is alert and oriented to person, place, and time.  Skin: Skin is warm and dry.  Psychiatric: She has a normal mood and affect.     Lab Results  Component Value Date   WBC 8.8 12/24/2014   HGB 12.9 12/24/2014   HCT 38.7 12/24/2014   MCV 90.2 12/24/2014   PLT 265 12/24/2014   Lab Results  Component Value Date   CREATININE 0.65 12/24/2014   BUN 10 12/24/2014   NA 136 12/24/2014   K 4.6 12/24/2014   CL 100 12/24/2014   CO2 28 12/24/2014    Lab Results  Component Value Date   HGBA1C 6.10 12/17/2014   Lipid Panel     Component Value Date/Time   CHOL 169 12/24/2014 0935   TRIG 232* 12/24/2014 0935   HDL 45* 12/24/2014 0935   CHOLHDL 3.8 12/24/2014 0935   VLDL 46* 12/24/2014 0935   LDLCALC 78 12/24/2014 0935       Assessment and plan:   Blayre was seen  today for follow-up.  Diagnoses and all orders for this visit:  Essential hypertension -     amLODipine (NORVASC) 5 MG tablet; Take 1 tablet (5 mg total) by mouth daily. Since patient never took Amlodipine, I will d/c HCTZ due to side effects and change to Amlodipine.   Hypertriglyceridemia -     Begin omega-3 acid ethyl esters (LOVAZA) 1 G capsule; Take 2 capsules (2 g total) by mouth 2 (two) times daily. Went over exercise, weight loss, and specific diet changes.   Due to language barrier, an interpreter was present during the history-taking and subsequent discussion (and for part of the physical exam) with this patient.  Return in about 3 months (around 04/02/2015) for Hypertension.       Lance Bosch, [place] [facility] [phone] 12/31/2014, 3:27 PM

## 2014-12-31 NOTE — Progress Notes (Signed)
Patient here for follow up on her HTN Patient complains of having headaches after taking her HCTZ Headaches started when she started taking the medication

## 2014-12-31 NOTE — Patient Instructions (Signed)
Lovaza--fish oil for your elevated triglycerides  Amlodipine---for blood pressure  Stop hydrochlorothiazide!!!   Food Choices to Lower Your Triglycerides Triglycerides are a type of fat in your blood. High levels of triglycerides can increase the risk of heart disease and stroke. If your triglyceride levels are high, the foods you eat and your eating habits are very important. Choosing the right foods can help lower your triglycerides.  WHAT GENERAL GUIDELINES DO I NEED TO FOLLOW?  Lose weight if you are overweight.   Limit or avoid alcohol.   Fill one half of your plate with vegetables and green salads.   Limit fruit to two servings a day. Choose fruit instead of juice.   Make one fourth of your plate whole grains. Look for the word "whole" as the first word in the ingredient list.  Fill one fourth of your plate with lean protein foods.  Enjoy fatty fish (such as salmon, mackerel, sardines, and tuna) three times a week.   Choose healthy fats.   Limit foods high in starch and sugar.  Eat more home-cooked food and less restaurant, buffet, and fast food.  Limit fried foods.  Cook foods using methods other than frying.  Limit saturated fats.  Check ingredient lists to avoid foods with partially hydrogenated oils (trans fats) in them. WHAT FOODS CAN I EAT?  Grains Whole grains, such as whole wheat or whole grain breads, crackers, cereals, and pasta. Unsweetened oatmeal, bulgur, barley, quinoa, or brown rice. Corn or whole wheat flour tortillas.  Vegetables Fresh or frozen vegetables (raw, steamed, roasted, or grilled). Green salads. Fruits All fresh, canned (in natural juice), or frozen fruits. Meat and Other Protein Products Ground beef (85% or leaner), grass-fed beef, or beef trimmed of fat. Skinless chicken or [place]. Ground chicken or [place]. Pork trimmed of fat. All fish and seafood. Eggs. Dried beans, peas, or lentils. Unsalted nuts or seeds. Unsalted canned or  dry beans. Dairy Low-fat dairy products, such as skim or 1% milk, 2% or reduced-fat cheeses, low-fat ricotta or cottage cheese, or plain low-fat yogurt. Fats and Oils Tub margarines without trans fats. Light or reduced-fat mayonnaise and salad dressings. Avocado. Safflower, olive, or canola oils. Natural peanut or almond butter. The items listed above may not be a complete list of recommended foods or beverages. Contact your dietitian for more options. WHAT FOODS ARE NOT RECOMMENDED?  Grains White bread. White pasta. White rice. Cornbread. Bagels, pastries, and croissants. Crackers that contain trans fat. Vegetables White potatoes. Corn. Creamed or fried vegetables. Vegetables in a cheese sauce. Fruits Dried fruits. Canned fruit in light or heavy syrup. Fruit juice. Meat and Other Protein Products Fatty cuts of meat. Ribs, chicken wings, bacon, sausage, bologna, salami, chitterlings, fatback, hot dogs, bratwurst, and packaged luncheon meats. Dairy Whole or 2% milk, cream, half-and-half, and cream cheese. Whole-fat or sweetened yogurt. Full-fat cheeses. Nondairy creamers and whipped toppings. Processed cheese, cheese spreads, or cheese curds. Sweets and Desserts Corn syrup, sugars, honey, and molasses. Candy. Jam and jelly. Syrup. Sweetened cereals. Cookies, pies, cakes, donuts, muffins, and ice cream. Fats and Oils Butter, stick margarine, lard, shortening, ghee, or bacon fat. Coconut, palm kernel, or palm oils. Beverages Alcohol. Sweetened drinks (such as sodas, lemonade, and fruit drinks or punches). The items listed above may not be a complete list of foods and beverages to avoid. Contact your dietitian for more information.   This information is not intended to replace advice given to you by your health care provider. Make sure you  discuss any questions you have with your health care provider.   Document Released: 12/02/2003 Document Revised: 03/06/2014 Document Reviewed:  12/18/2012 Elsevier Interactive Patient Education [employer].

## 2015-01-19 ENCOUNTER — Telehealth: Payer: Self-pay

## 2015-01-19 DIAGNOSIS — E781 Pure hyperglyceridemia: Secondary | ICD-10-CM

## 2015-01-19 MED ORDER — OMEGA-3-ACID ETHYL ESTERS 1 G PO CAPS: 2.0000 g | ORAL_CAPSULE | Freq: Two times a day (BID) | ORAL | Status: AC

## 2015-01-19 NOTE — Telephone Encounter (Signed)
-----   Message from Lance Bosch, NP sent at 01/18/2015  5:39 PM EST ----- Triglycerides are elevated. Make sure she is taking Lovaza to help bring that level down

## 2015-01-19 NOTE — Telephone Encounter (Signed)
Spoke with patient and she is aware of her lab results Prescription resent to pharmacy  Patient did not pick up because she had no money that day

## 2015-07-05 ENCOUNTER — Ambulatory Visit: Payer: Self-pay | Attending: Internal Medicine

## 2015-09-25 ENCOUNTER — Ambulatory Visit (HOSPITAL_COMMUNITY)
Admission: EM | Admit: 2015-09-25 | Discharge: 2015-09-25 | Disposition: A | Discharge status: Home / Self Care | Payer: Self-pay | Attending: Emergency Medicine | Admitting: Emergency Medicine

## 2015-09-25 ENCOUNTER — Encounter (HOSPITAL_COMMUNITY): Payer: Self-pay | Admitting: *Deleted

## 2015-09-25 DIAGNOSIS — H8110 Benign paroxysmal vertigo, unspecified ear: Secondary | ICD-10-CM

## 2015-09-25 MED ORDER — MECLIZINE HCL 25 MG PO TABS: 25.0000 mg | ORAL_TABLET | Freq: Three times a day (TID) | ORAL | 0 refills | Status: AC | PRN

## 2015-09-25 NOTE — ED Triage Notes (Signed)
Pt  Reports   Yesterday    She  Developed   Headache   dizzyness   And  Ringing  In  Her  Ears       She  Reports  That  Is  Better   But  Now  Has      Some   Weakness    And   Some  Swelling  In  Past

## 2015-09-27 NOTE — ED Provider Notes (Signed)
CSN: [id number]     Arrival date & time 09/25/15  1708 History   None    Chief Complaint  Patient presents with  . Dizziness   (Consider location/radiation/quality/duration/timing/severity/associated sxs/prior Treatment) The history is provided by the patient. The history is limited by a language barrier. A language interpreter was used.  Dizziness  Quality:  Vertigo and room spinning (No symptoms currently sitting still) Severity:  Moderate Onset quality:  Sudden Duration: Startd yesterday. Timing:  Intermittent Progression:  Waxing and waning Chronicity:  Recurrent (Have had hx of vertigo before) Context: head movement   Context: not with loss of consciousness and not with medication   Context comment:  Occurs with sudden head movement or when she walks too fast Relieved by:  Being still Worsened by:  Movement and turning head Ineffective treatments:  None tried Associated symptoms: headaches and tinnitus   Associated symptoms: no chest pain, no hearing loss, no nausea, no palpitations, no shortness of breath, no syncope, no vision changes and no weakness     Past Medical History:  Diagnosis Date  . Fibroids    Past Surgical History:  Procedure Laterality Date  . ABDOMINAL HYSTERECTOMY N/A 10/22/2012   Procedure: HYSTERECTOMY ABDOMINAL;  Surgeon: Jonnie Kind, MD;  Location: [facility];  Service: Gynecology;  Laterality: N/A;  . TUBAL LIGATION     Family History  Problem Relation Age of Onset  . Diabetes Mother   . Diabetes Brother    Social History  Substance Use Topics  . Smoking status: Never Smoker  . Smokeless tobacco: Never Used  . Alcohol use No   OB History    Gravida Para Term Preterm AB Living   4 4           SAB TAB Ectopic Multiple Live Births                 Review of Systems  Constitutional: Positive for fatigue. Negative for chills and fever.  HENT: Positive for tinnitus. Negative for hearing loss.   Respiratory: Negative for shortness of  breath and wheezing.   Cardiovascular: Negative for chest pain, palpitations and syncope.  Gastrointestinal: Negative for nausea.  Neurological: Positive for dizziness and headaches. Negative for weakness.       Positive for vertigo     Allergies  Review of patient's allergies indicates no known allergies.  Home Medications   Prior to Admission medications   Medication Sig Start Date End Date Taking? Authorizing Provider  amLODipine (NORVASC) 5 MG tablet Take 1 tablet (5 mg total) by mouth daily. 12/31/14   Lance Bosch, NP  aspirin 81 MG tablet Take 81 mg by mouth daily.    Historical Provider, MD  meclizine (ANTIVERT) 25 MG tablet Take 1 tablet (25 mg total) by mouth 3 (three) times daily as needed for dizziness. May Take 1-2 tabs every 8 hours as needed 09/25/15 09/30/15  Barry Dienes, NP  Multiple Vitamin (MULTIVITAMIN WITH MINERALS) TABS tablet Take 1 tablet by mouth daily.    Historical Provider, MD  omega-3 acid ethyl esters (LOVAZA) 1 G capsule Take 2 capsules (2 g total) by mouth 2 (two) times daily. 01/19/15   Lance Bosch, NP   Meds Ordered and Administered this Visit  Medications - No data to display  BP 155/82 (BP Location: Right Arm)   Pulse (!) 58   Temp 98.6 F (37 C) (Oral)   Resp 18   LMP 10/01/2012   SpO2 100%  No data  found.   Physical Exam  Constitutional: She is oriented to person, place, and time. She appears well-developed and well-nourished.  HENT:  Head: Normocephalic and atraumatic.  Ear canals clear; TM pearly gray bilaterally without erythema   Eyes: Conjunctivae and EOM are normal. Pupils are equal, round, and reactive to light.  Cardiovascular: Normal rate, regular rhythm and normal heart sounds.   Pulmonary/Chest: Effort normal and breath sounds normal.  Musculoskeletal: Normal range of motion.  Neurological: She is alert and oriented to person, place, and time. No cranial nerve deficit. Coordination normal.    Urgent Care Course    Clinical Course    Procedures (including critical care time)  Labs Review Labs Reviewed - No data to display  Imaging Review No results found.   Visual Acuity Review  Right Eye Distance:   Left Eye Distance:   Bilateral Distance:    Right Eye Near:   Left Eye Near:    Bilateral Near:         MDM   1. BPPV (benign paroxysmal positional vertigo), unspecified laterality    Patient's signs and symptoms are most consistent with benign paroxysmal positional vertigo. Patient educated about the diagnosis. Informed that this is a benign condition and is usually self-limited with symptoms resolve in 4-6 weeks. Educated that canalith repositioning procedure at home will help to reposition the debris in the labyrinth back into the vestibule. Education handout given on how to perform the modified epley maneuver for self-treatment at home. Instructed to perform this maneuver three times a day until the symptoms have abated for 24 hours. Instructed to follow up with PCP if she does not improve, or to go to Emergency department if she worsens.     Barry Dienes, NP 09/27/15 1308

## 2015-09-30 ENCOUNTER — Ambulatory Visit: Payer: Self-pay | Attending: Internal Medicine | Admitting: Physician Assistant

## 2015-09-30 VITALS — BP 145/86 | HR 60 | Temp 98.6°F | Resp 16 | Wt 182.0 lb

## 2015-09-30 DIAGNOSIS — I10 Essential (primary) hypertension: Secondary | ICD-10-CM | POA: Insufficient documentation

## 2015-09-30 DIAGNOSIS — Z7982 Long term (current) use of aspirin: Secondary | ICD-10-CM | POA: Insufficient documentation

## 2015-09-30 DIAGNOSIS — Z79899 Other long term (current) drug therapy: Secondary | ICD-10-CM | POA: Insufficient documentation

## 2015-09-30 DIAGNOSIS — R1010 Upper abdominal pain, unspecified: Secondary | ICD-10-CM

## 2015-09-30 DIAGNOSIS — R300 Dysuria: Secondary | ICD-10-CM | POA: Insufficient documentation

## 2015-09-30 DIAGNOSIS — R1013 Epigastric pain: Secondary | ICD-10-CM | POA: Insufficient documentation

## 2015-09-30 LAB — CBC WITH DIFFERENTIAL/PLATELET
BASOS ABS: 0 {cells}/uL (ref 0–200)
Basophils Relative: 0 %
EOS PCT: 1 %
Eosinophils Absolute: 82 cells/uL (ref 15–500)
HCT: 40.9 % (ref 35.0–45.0)
HEMOGLOBIN: 13.7 g/dL (ref 11.7–15.5)
LYMPHS ABS: 2542 {cells}/uL (ref 850–3900)
Lymphocytes Relative: 31 %
MCH: 30.4 pg (ref 27.0–33.0)
MCHC: 33.5 g/dL (ref 32.0–36.0)
MCV: 90.9 fL (ref 80.0–100.0)
MONO ABS: 492 {cells}/uL (ref 200–950)
MPV: 10.7 fL (ref 7.5–12.5)
Monocytes Relative: 6 %
NEUTROS ABS: 5084 {cells}/uL (ref 1500–7800)
Neutrophils Relative %: 62 %
Platelets: 272 10*3/uL (ref 140–400)
RBC: 4.5 MIL/uL (ref 3.80–5.10)
RDW: 13.3 % (ref 11.0–15.0)
WBC: 8.2 10*3/uL (ref 3.8–10.8)

## 2015-09-30 LAB — POCT URINALYSIS DIPSTICK
Bilirubin, UA: NEGATIVE
GLUCOSE UA: NEGATIVE
KETONES UA: NEGATIVE
LEUKOCYTES UA: NEGATIVE
Nitrite, UA: NEGATIVE
PROTEIN UA: NEGATIVE
Spec Grav, UA: 1.015
Urobilinogen, UA: 0.2
pH, UA: 6.5

## 2015-09-30 LAB — COMPREHENSIVE METABOLIC PANEL
ALT: 22 U/L (ref 6–29)
AST: 15 U/L (ref 10–35)
Albumin: 4.4 g/dL (ref 3.6–5.1)
Alkaline Phosphatase: 88 U/L (ref 33–130)
BUN: 9 mg/dL (ref 7–25)
CHLORIDE: 103 mmol/L (ref 98–110)
CO2: 28 mmol/L (ref 20–31)
Calcium: 9.8 mg/dL (ref 8.6–10.4)
Creat: 0.7 mg/dL (ref 0.50–1.05)
GLUCOSE: 81 mg/dL (ref 65–99)
POTASSIUM: 4.9 mmol/L (ref 3.5–5.3)
Sodium: 141 mmol/L (ref 135–146)
Total Bilirubin: 0.7 mg/dL (ref 0.2–1.2)
Total Protein: 7.4 g/dL (ref 6.1–8.1)

## 2015-09-30 MED ORDER — HYDROCHLOROTHIAZIDE 12.5 MG PO CAPS: 12.5000 mg | ORAL_CAPSULE | Freq: Every day | ORAL | 3 refills | Status: AC

## 2015-09-30 MED ORDER — OMEPRAZOLE 20 MG PO CPDR: 20.0000 mg | DELAYED_RELEASE_CAPSULE | Freq: Every day | ORAL | 3 refills | Status: DC

## 2015-09-30 MED FILL — ?HYDROCHLOROTHIAZIDE 12.5MG: 12.5 | 30 days supply | Qty: 30 | Fill #0

## 2015-09-30 MED FILL — ?OMEPRAZOLE DR 20 MG CAPSUL: 20 | 30 days supply | Qty: 30 | Fill #0

## 2015-09-30 NOTE — Progress Notes (Signed)
Pt is in the office today for abdominal pain Pt pain level today in the office is a 2 PT states she has been having dizziness Pt states the dizziness comes and goes Pt states the abdominal pain started 2 weeks ago

## 2015-09-30 NOTE — Progress Notes (Signed)
Patient ID: Sandra Benson, female   DOB: 06/06/1965, 50 y.o.   MRN: [id number]   Sandra Benson, is a 50 y.o. female  [id number]  [id number]  DOB - [birth year]  Subjective:  Chief Complaint and HPI: Sandra Benson is a 50 y.o. female here today for a f/up from being seen at Urgent Care on 09/25/2015 for vertigo.  Her vertigo is almost completely resolved.  She now complaining of abdominal pain/midepigastric pain X 2 weeks.  The pain is sharp and comes and goes.  No precipitating or alleviating factors.  Unchanged by food.  Her appetite is good.  No N/V/D/C.  She denies pelvic pain or vaginal discharge.  +hysterectomy.  She did have 1 episode of painful urination yesterday.  No melena. No hematochezia.  History provided by patient with phone interpreter.   She also c/o "whole body swelling" for years.   Urgent care notes reviewed.     ROS:   Constitutional:  No f/c, No night sweats, No unexplained weight loss. EENT:  No vision changes, No blurry vision, No hearing changes. No mouth, throat, or ear problems.  Respiratory: No cough, No SOB Cardiac: No CP, no palpitations GI:  +abd pain, No N/V/D. GU: +Urinary s/sx X 1 yetserday Musculoskeletal: No joint pain Neuro: No headache, no dizziness, no motor weakness.  Skin: No rash Endocrine:  No polydipsia. No polyuria.  Psych: Denies SI/HI  No problems updated.  ALLERGIES: No Known Allergies  PAST MEDICAL HISTORY: Past Medical History:  Diagnosis Date  . Fibroids     MEDICATIONS AT HOME: Prior to Admission medications   Medication Sig Start Date End Date Taking? Authorizing Provider  amLODipine (NORVASC) 5 MG tablet Take 1 tablet (5 mg total) by mouth daily. 12/31/14  Yes Lance Bosch, NP  aspirin 81 MG tablet Take 81 mg by mouth daily.   Yes Historical Provider, MD  meclizine (ANTIVERT) 25 MG tablet Take 1 tablet (25 mg total) by mouth 3 (three) times daily as needed for dizziness. May Take 1-2 tabs every 8 hours as needed 09/25/15  09/30/15 Yes Barry Dienes, NP  Multiple Vitamin (MULTIVITAMIN WITH MINERALS) TABS tablet Take 1 tablet by mouth daily.   Yes Historical Provider, MD  omega-3 acid ethyl esters (LOVAZA) 1 G capsule Take 2 capsules (2 g total) by mouth 2 (two) times daily. 01/19/15  Yes Lance Bosch, NP  hydrochlorothiazide (MICROZIDE) 12.5 MG capsule Take 1 capsule (12.5 mg total) by mouth daily. 09/30/15   Argentina Donovan, PA-C  omeprazole (PRILOSEC) 20 MG capsule Take 1 capsule (20 mg total) by mouth daily. 09/30/15   Argentina Donovan, PA-C     Objective:  EXAM:   Vitals:   09/30/15 1013  BP: (!) 145/86  Pulse: 60  Resp: 16  Temp: 98.6 F (37 C)  TempSrc: Oral  SpO2: 99%  Weight: 182 lb (82.6 kg)    General appearance : A&OX3. NAD. Non-toxic-appearing HEENT: Atraumatic and Normocephalic.  PERRLA. EOM intact.  TM clear B. Mouth-MMM, post pharynx WNL w/o erythema, No PND. Neck: supple, no JVD. No cervical lymphadenopathy. No thyromegaly Chest/Lungs:  Breathing-non-labored, Good air entry bilaterally, breath sounds normal without rales, rhonchi, or wheezing  CVS: S1 S2 regular, no murmurs, gallops, rubs  Abdomen:  BS present X4 .  Abdomen is soft and non-distended.  No guarding, no rebound.  Neg Murphy's and McBurneys.  No fluid wave.  No megaly.  Extremities: Bilateral Lower Ext shows no edema, both legs are warm to touch  with = pulse throughout Neurology:  CN II-XII grossly intact, Non focal.   Psych:  TP linear. J/I WNL. Normal speech. Appropriate eye contact and affect.  Skin:  No Rash  Data Review Lab Results  Component Value Date   HGBA1C 6.10 12/17/2014     Assessment & Plan   1. Pain of upper abdomen Non-acute abdomen - Comprehensive metabolic panel - H. pylori breath test - CBC with Differential/Platelet - omeprazole (PRILOSEC) 20 MG capsule; Take 1 capsule (20 mg total) by mouth daily.  Dispense: 30 capsule; Refill: 3  2. Dysuria Trace hematuria - POCT urinalysis dipstick -  Urine culture  3. Essential hypertension-suboptimal control Continue amlodipine - hydrochlorothiazide (MICROZIDE) 12.5 MG capsule; Take 1 capsule (12.5 mg total) by mouth daily.  Dispense: 30 capsule; Refill: 3 HCTZ should also help with her feeling like she is retaining water.     Patient have been counseled extensively about nutrition and exercise  Return in about 2 weeks (around 10/14/2015) for f/up with me to recheck abdominal pain and htn.  The patient was given clear instructions to go to ER or return to medical center if symptoms don't improve, worsen or new problems develop. The patient verbalized understanding. The patient was told to call to get lab results if they haven't heard anything in the next week.     Freeman Caldron, PA-C [facility] and [facility] [place], [place]   09/30/2015, 2:00 PM

## 2015-10-01 LAB — URINE CULTURE: Organism ID, Bacteria: 10000

## 2015-10-01 LAB — H. PYLORI BREATH TEST: H. PYLORI BREATH TEST: NOT DETECTED

## 2015-10-05 ENCOUNTER — Other Ambulatory Visit: Payer: Self-pay | Admitting: Physician Assistant

## 2015-10-05 MED ORDER — NITROFURANTOIN MONOHYD MACRO 100 MG PO CAPS: 100.0000 mg | ORAL_CAPSULE | Freq: Two times a day (BID) | ORAL | 0 refills | Status: DC

## 2015-10-06 ENCOUNTER — Telehealth: Payer: Self-pay

## 2015-10-06 NOTE — Telephone Encounter (Signed)
[place] [id number] Contacted pt to go over lab results pt is aware of results and doesn't have any questions or concerns.

## 2015-10-07 ENCOUNTER — Ambulatory Visit: Payer: Self-pay | Attending: Internal Medicine

## 2016-02-02 ENCOUNTER — Ambulatory Visit: Payer: Self-pay | Attending: Internal Medicine

## 2016-02-04 ENCOUNTER — Ambulatory Visit: Payer: Self-pay | Attending: Family Medicine | Admitting: Family Medicine

## 2016-02-04 ENCOUNTER — Encounter: Payer: Self-pay | Admitting: Family Medicine

## 2016-02-04 VITALS — BP 127/80 | HR 88 | Temp 98.5°F | Resp 16 | Wt 181.0 lb

## 2016-02-04 DIAGNOSIS — M6281 Muscle weakness (generalized): Secondary | ICD-10-CM

## 2016-02-04 DIAGNOSIS — M79604 Pain in right leg: Secondary | ICD-10-CM

## 2016-02-04 DIAGNOSIS — R1031 Right lower quadrant pain: Secondary | ICD-10-CM | POA: Insufficient documentation

## 2016-02-04 DIAGNOSIS — Z0000 Encounter for general adult medical examination without abnormal findings: Secondary | ICD-10-CM

## 2016-02-04 DIAGNOSIS — M25512 Pain in left shoulder: Secondary | ICD-10-CM

## 2016-02-04 DIAGNOSIS — R200 Anesthesia of skin: Secondary | ICD-10-CM

## 2016-02-04 DIAGNOSIS — R1010 Upper abdominal pain, unspecified: Secondary | ICD-10-CM

## 2016-02-04 LAB — CBC WITH DIFFERENTIAL/PLATELET
Basophils Absolute: 0 cells/uL (ref 0–200)
Basophils Relative: 0 %
EOS ABS: 78 {cells}/uL (ref 15–500)
EOS PCT: 1 %
HCT: 41.3 % (ref 35.0–45.0)
Hemoglobin: 13.6 g/dL (ref 11.7–15.5)
LYMPHS ABS: 2028 {cells}/uL (ref 850–3900)
Lymphocytes Relative: 26 %
MCH: 29.1 pg (ref 27.0–33.0)
MCHC: 32.9 g/dL (ref 32.0–36.0)
MCV: 88.4 fL (ref 80.0–100.0)
MONO ABS: 390 {cells}/uL (ref 200–950)
MONOS PCT: 5 %
MPV: 10.9 fL (ref 7.5–12.5)
NEUTROS ABS: 5304 {cells}/uL (ref 1500–7800)
Neutrophils Relative %: 68 %
Platelets: 284 10*3/uL (ref 140–400)
RBC: 4.67 MIL/uL (ref 3.80–5.10)
RDW: 13.5 % (ref 11.0–15.0)
WBC: 7.8 10*3/uL (ref 3.8–10.8)

## 2016-02-04 LAB — HEPATIC FUNCTION PANEL
ALBUMIN: 4.1 g/dL (ref 3.6–5.1)
ALT: 20 U/L (ref 6–29)
AST: 20 U/L (ref 10–35)
Alkaline Phosphatase: 84 U/L (ref 33–130)
BILIRUBIN TOTAL: 0.8 mg/dL (ref 0.2–1.2)
Bilirubin, Direct: 0.1 mg/dL (ref <=0.2)
Indirect Bilirubin: 0.7 mg/dL (ref 0.2–1.2)
Total Protein: 7.1 g/dL (ref 6.1–8.1)

## 2016-02-04 LAB — BASIC METABOLIC PANEL
BUN: 12 mg/dL (ref 7–25)
CALCIUM: 9.2 mg/dL (ref 8.6–10.4)
CO2: 26 mmol/L (ref 20–31)
Chloride: 104 mmol/L (ref 98–110)
Creat: 0.63 mg/dL (ref 0.50–1.05)
GLUCOSE: 75 mg/dL (ref 65–99)
POTASSIUM: 4.3 mmol/L (ref 3.5–5.3)
SODIUM: 139 mmol/L (ref 135–146)

## 2016-02-04 MED ORDER — IBUPROFEN 400 MG PO TABS: 400.0000 mg | ORAL_TABLET | Freq: Four times a day (QID) | ORAL | 0 refills | Status: AC | PRN

## 2016-02-04 MED ORDER — OMEPRAZOLE 20 MG PO CPDR: 20.0000 mg | DELAYED_RELEASE_CAPSULE | Freq: Every day | ORAL | 0 refills | Status: AC

## 2016-02-04 NOTE — Patient Instructions (Addendum)
Needs to pick up contrast at [facility] or [facility].  And Make sure they provide an instruction sheet. Dolor musculoesqueltico (Musculoskeletal Pain) El dolor musculoesqueltico comprende dolores y Scientist, research (life sciences) en los msculos y en los [place]. Este dolor puede ocurrir en cualquier parte del cuerpo. INSTRUCCIONES PARA EL CUIDADO EN EL HOGAR  Solo tome medicamentos para calmar el dolor, el malestar o bajar la fiebre, segn las indicaciones del mdico.  Podr seguir con todas las actividades a menos que stas le ocasionen ms [employer]. Cuando el dolor disminuya, retome las actividades habituales lentamente. Aumente gradualmente la intensidad y la duracin de sus actividades o del ejercicio.  Durante los perodos de dolor intenso, el reposo en cama puede ser beneficioso. Acustese o sintese en cualquier posicin que sea cmoda, pero salga de la cama y camine al menos cada varias horas.  Si se lo indican, aplique hielo sobre la zona de la lesin.  Ponga el hielo en una bolsa plstica.  Coloque una toalla entre la piel y la bolsa de hielo.  Coloque el hielo durante 41minutos, 2 a 3veces por Training and development officer. SOLICITE ATENCIN MDICA SI:  El dolor empeora.  El dolor no se alivia con los [employer].  Pierde la funcionalidad en la zona del dolor si este se manifiesta en los brazos, las piernas o el cuello. Esta informacin no tiene Marine scientist el consejo del mdico. Asegrese de hacerle al mdico cualquier pregunta que tenga. Document Released: 11/23/2004 Document Revised: 05/08/2011 Document Reviewed: 10/18/2012 Elsevier Interactive Patient Education  2017 [place] abdominal en adultos (Abdominal Pain, Adult) El dolor de [place] (abdominal) puede tener muchas causas. [place] veces, el dolor de [place] no es peligroso. Muchos de [employer] de dolor de estmago pueden controlarse y tratarse en casa. CUIDADOS EN EL HOGAR   No tome medicamentos que lo ayuden a  defecar (laxantes), salvo que su mdico se lo indique.  Solo tome los medicamentos que le haya indicado su mdico.  Coma o beba lo que le indique su mdico. Su mdico le dir si debe seguir una dieta especial. SOLICITE AYUDA SI:  No sabe cul es la causa del dolor de [place].  Tiene dolor de estmago cuando siente ganas de vomitar (nuseas) o tiene colitis (diarrea).  Tiene dolor durante la miccin o la evacuacin.  El dolor de estmago lo despierta de noche.  Tiene dolor de [employer] empeora o [place] cuando come.  Tiene dolor de [employer] empeora cuando come [employer].  Tiene fiebre. SOLICITE AYUDA DE INMEDIATO SI:   El dolor no desaparece en un plazo mximo de 2horas.  No deja de (vomitar).  El dolor cambia y se Administrator, sports solo en la parte derecha o izquierda del [place].  La materia fecal es sanguinolenta o de aspecto alquitranado. ASEGRESE DE QUE:   Comprende estas instrucciones.  Controlar su afeccin.  Recibir ayuda de inmediato si no mejora o si empeora. Esta informacin no tiene Marine scientist el consejo del mdico. Asegrese de hacerle al mdico cualquier pregunta que tenga. Document Released: 05/12/2008 Document Revised: 03/06/2014 Elsevier Interactive Patient Education  2017 [employer].

## 2016-02-04 NOTE — Progress Notes (Signed)
Follow upintermittent abdominal pain. Mammogram.

## 2016-02-05 NOTE — Progress Notes (Signed)
Subjective:  Patient ID: Sandra Benson, female    DOB: 1965/03/12  Age: 50 y.o. MRN: [id number]  CC: Abdominal Pain   HPI Sandra Benson presents for c/o right upper and lower quadrant abdominal pain. She reports pain radiates to lower back and right leg. She also c/o right upper leg numbness. She denies numbness of the right lower leg or toes. She also has c/o left shoulder pain which radiates to the left side of her neck to posterior head. She says symptoms began several months ago. She denies any fevers. She denies any vomiting. She does report nausea after eating meals. She denies any change in bowel or bladder habits. She denies any headaches or dizziness.   Outpatient Medications Prior to Visit  Medication Sig Dispense Refill  . omeprazole (PRILOSEC) 20 MG capsule Take 1 capsule (20 mg total) by mouth daily. 30 capsule 3  . amLODipine (NORVASC) 5 MG tablet Take 1 tablet (5 mg total) by mouth daily. (Patient not taking: Reported on 02/04/2016) 30 tablet 3  . aspirin 81 MG tablet Take 81 mg by mouth daily.    . hydrochlorothiazide (MICROZIDE) 12.5 MG capsule Take 1 capsule (12.5 mg total) by mouth daily. (Patient not taking: Reported on 02/04/2016) 30 capsule 3  . Multiple Vitamin (MULTIVITAMIN WITH MINERALS) TABS tablet Take 1 tablet by mouth daily.    . nitrofurantoin, macrocrystal-monohydrate, (MACROBID) 100 MG capsule Take 1 capsule (100 mg total) by mouth 2 (two) times daily. (Patient not taking: Reported on 02/04/2016) 6 capsule 0  . omega-3 acid ethyl esters (LOVAZA) 1 G capsule Take 2 capsules (2 g total) by mouth 2 (two) times daily. (Patient not taking: Reported on 02/04/2016) 180 capsule 5   No facility-administered medications prior to visit.     ROS Review of Systems  Constitutional: Negative.   Respiratory: Negative.   Cardiovascular: Negative.   Gastrointestinal: Positive for abdominal pain (RUQ/RLQ). Negative for nausea and vomiting.  Genitourinary: Negative for dysuria.    Musculoskeletal: Positive for back pain, myalgias and neck pain.  Neurological: Negative for dizziness and headaches.    Objective:  BP 127/80   Pulse 88   Temp 98.5 F (36.9 C) (Oral)   Resp 16   Wt 181 lb (82.1 kg)   LMP 10/01/2012   SpO2 99%   BMI 34.20 kg/m   BP/Weight 02/04/2016 09/30/2015 0000000  Systolic BP [id number]  Diastolic BP 80 86 82  Wt. (Lbs) 181 182 -  BMI 34.2 34.39 -    Physical Exam  Constitutional: She is oriented to person, place, and time. She appears well-developed and well-nourished.  Neck: Normal range of motion. Neck supple.  Cardiovascular: Normal rate, regular rhythm, normal heart sounds and intact distal pulses.   Pulmonary/Chest: Effort normal and breath sounds normal.  Abdominal: Soft. She exhibits no distension and no mass. There is tenderness (RUQ/RLQ). There is no rebound and no guarding.  Musculoskeletal:       Right shoulder: Normal.       Left shoulder: She exhibits pain. She exhibits no tenderness, no swelling, no crepitus, no deformity and normal strength.       Left hand: She exhibits normal capillary refill, no deformity and no swelling. Decreased strength (4/5 muscle strength ) noted.  Lymphadenopathy:    She has no cervical adenopathy.  Neurological: She is alert and oriented to person, place, and time.  Skin: Skin is warm and dry.  Psychiatric: She has a normal mood and affect. Her behavior  is normal. Thought content normal.   Assessment & Plan:   Problem List Items Addressed This Visit    None    Visit Diagnoses    Right lower quadrant abdominal pain    -  Primary   Relevant Orders   CBC with Differential (Completed)   Basic Metabolic Panel (Completed)   CT Abdomen Pelvis W Contrast   Hepatic Function Panel (Completed)   Left shoulder pain, unspecified chronicity       Relevant Medications   ibuprofen (ADVIL,MOTRIN) 400 MG tablet   Other Relevant Orders   AMB referral to orthopedics   Muscle weakness of left arm        Relevant Orders   AMB referral to orthopedics   Right leg pain       Relevant Medications   ibuprofen (ADVIL,MOTRIN) 400 MG tablet   Other Relevant Orders   AMB referral to orthopedics   Right leg numbness       Relevant Orders   AMB referral to orthopedics   Pain of upper abdomen       Relevant Medications   omeprazole (PRILOSEC) 20 MG capsule   Healthcare maintenance       Relevant Orders   MM Digital Screening      Meds ordered this encounter  Medications  . ibuprofen (ADVIL,MOTRIN) 400 MG tablet    Sig: Take 1 tablet (400 mg total) by mouth every 6 (six) hours as needed (Take with food.).    Dispense:  30 tablet    Refill:  0    Order Specific Question:   Supervising Provider    Answer:   Tresa Garter [id number]  . omeprazole (PRILOSEC) 20 MG capsule    Sig: Take 1 capsule (20 mg total) by mouth daily.    Dispense:  30 capsule    Refill:  0    Order Specific Question:   Supervising Provider    Answer:   Tresa Garter [id number]    Follow-up: Return if symptoms worsen or fail to improve.   Alfonse Spruce FNP

## 2016-02-08 ENCOUNTER — Ambulatory Visit (HOSPITAL_COMMUNITY)
Admission: RE | Admit: 2016-02-08 | Discharge: 2016-02-08 | Disposition: A | Discharge status: Home / Self Care | Payer: Self-pay | Source: Ambulatory Visit | Attending: Family Medicine | Admitting: Family Medicine

## 2016-02-08 DIAGNOSIS — K439 Ventral hernia without obstruction or gangrene: Secondary | ICD-10-CM | POA: Insufficient documentation

## 2016-02-08 DIAGNOSIS — K760 Fatty (change of) liver, not elsewhere classified: Secondary | ICD-10-CM | POA: Insufficient documentation

## 2016-02-08 DIAGNOSIS — R1031 Right lower quadrant pain: Secondary | ICD-10-CM | POA: Insufficient documentation

## 2016-02-08 MED ORDER — IOPAMIDOL (ISOVUE-300) INJECTION 61%
100.0000 mL | Freq: Once | INTRAVENOUS | Status: AC | PRN
  Administered 2016-02-08: 100 mL via INTRAVENOUS

## 2016-02-08 MED ORDER — IOPAMIDOL (ISOVUE-300) INJECTION 61%
30.0000 mL | Freq: Once | INTRAVENOUS | Status: AC | PRN
  Administered 2016-02-08: 30 mL via ORAL

## 2016-02-10 NOTE — Progress Notes (Signed)
Letter sent.

## 2016-03-02 ENCOUNTER — Ambulatory Visit (INDEPENDENT_AMBULATORY_CARE_PROVIDER_SITE_OTHER): Payer: Self-pay

## 2016-03-02 ENCOUNTER — Encounter: Payer: Self-pay | Admitting: Family Medicine

## 2016-03-02 ENCOUNTER — Ambulatory Visit (INDEPENDENT_AMBULATORY_CARE_PROVIDER_SITE_OTHER): Payer: Self-pay | Admitting: Orthopaedic Surgery

## 2016-03-02 ENCOUNTER — Ambulatory Visit: Payer: Self-pay | Attending: Family Medicine | Admitting: Family Medicine

## 2016-03-02 ENCOUNTER — Encounter (INDEPENDENT_AMBULATORY_CARE_PROVIDER_SITE_OTHER): Payer: Self-pay | Admitting: Orthopaedic Surgery

## 2016-03-02 VITALS — BP 129/82 | HR 72 | Temp 99.0°F | Resp 18 | Ht 60.0 in | Wt 181.8 lb

## 2016-03-02 DIAGNOSIS — K0889 Other specified disorders of teeth and supporting structures: Secondary | ICD-10-CM | POA: Insufficient documentation

## 2016-03-02 DIAGNOSIS — R05 Cough: Secondary | ICD-10-CM | POA: Insufficient documentation

## 2016-03-02 DIAGNOSIS — M7542 Impingement syndrome of left shoulder: Secondary | ICD-10-CM

## 2016-03-02 DIAGNOSIS — K029 Dental caries, unspecified: Secondary | ICD-10-CM | POA: Insufficient documentation

## 2016-03-02 DIAGNOSIS — J019 Acute sinusitis, unspecified: Secondary | ICD-10-CM

## 2016-03-02 DIAGNOSIS — M1611 Unilateral primary osteoarthritis, right hip: Secondary | ICD-10-CM

## 2016-03-02 DIAGNOSIS — Z7982 Long term (current) use of aspirin: Secondary | ICD-10-CM | POA: Insufficient documentation

## 2016-03-02 DIAGNOSIS — R059 Cough, unspecified: Secondary | ICD-10-CM

## 2016-03-02 DIAGNOSIS — Z79899 Other long term (current) drug therapy: Secondary | ICD-10-CM | POA: Insufficient documentation

## 2016-03-02 MED ORDER — FLUTICASONE PROPIONATE 50 MCG/ACT NA SUSP: 2.0000 | Freq: Every day | NASAL | 0 refills | Status: AC

## 2016-03-02 MED ORDER — METHOCARBAMOL 500 MG PO TABS: 500.0000 mg | ORAL_TABLET | Freq: Four times a day (QID) | ORAL | 2 refills | Status: AC | PRN

## 2016-03-02 MED ORDER — DICLOFENAC SODIUM 75 MG PO TBEC: 75.0000 mg | DELAYED_RELEASE_TABLET | Freq: Two times a day (BID) | ORAL | 2 refills | Status: AC

## 2016-03-02 MED ORDER — BENZONATATE 100 MG PO CAPS: 100.0000 mg | ORAL_CAPSULE | Freq: Three times a day (TID) | ORAL | 0 refills | Status: AC | PRN

## 2016-03-02 MED ORDER — TRAMADOL HCL 50 MG PO TABS: 50.0000 mg | ORAL_TABLET | Freq: Three times a day (TID) | ORAL | 0 refills | Status: AC | PRN

## 2016-03-02 MED ORDER — AMOXICILLIN-POT CLAVULANATE 875-125 MG PO TABS: 1.0000 | ORAL_TABLET | Freq: Two times a day (BID) | ORAL | 0 refills | Status: AC

## 2016-03-02 MED ORDER — PREDNISONE 10 MG (21) PO TBPK: ORAL_TABLET | ORAL | 0 refills | Status: DC

## 2016-03-02 MED FILL — traMADol HCL 50 MG TABS: 50 | 10 days supply | Qty: 30 | Fill #0

## 2016-03-02 MED FILL — ?PREDNISONE 10 MG TABLET: 10 | 6 days supply | Qty: 21 | Fill #0

## 2016-03-02 MED FILL — FLUTICASONE PROP 50 MCG SPR: 50 | 30 days supply | Qty: 16 | Fill #0

## 2016-03-02 MED FILL — BENZONATATE 100 MG CAPSULE: 100 | 7 days supply | Qty: 20 | Fill #0

## 2016-03-02 MED FILL — DICLOFENAC SOD DR 75 MG TAB: 75 | 15 days supply | Qty: 30 | Fill #0

## 2016-03-02 MED FILL — AMOX-CLAV 875-125 MG TABLET: 875-125 | 7 days supply | Qty: 14 | Fill #0

## 2016-03-02 MED FILL — METHOCARBAMOL 500 MG TABLET: 500 | 7 days supply | Qty: 30 | Fill #0

## 2016-03-02 NOTE — Patient Instructions (Signed)
Benzonatate capsules Qu es este medicamento? [place] se [place] para tratar la tos. Este medicamento puede ser utilizado para otros usos; si tiene alguna pregunta consulte con su proveedor de atencin mdica o con su Development worker, international aid. MARCAS COMUNES: Tessalon Perles, Zonatuss Qu le debo informar a mi profesional de la salud antes de tomar este medicamento? Necesita saber si usted presenta alguno de los [employer] o situaciones: -enfermedad renal o heptica -una reaccin alrgica o inusual al benzonatato, a los anestsicos, a otros medicamentos, alimentos, colorantes o conservantes -si est embarazada o buscando quedar embarazada -si est amamantando a un beb Cmo debo utilizar este medicamento? Tome este medicamento por va oral con un vaso de agua. Siga las instrucciones de la etiqueta del [place]. Evite romper, Engineer, manufacturing systems o chupar las cpsulas ya que esto podra causar efectos secundarios graves. Tome sus dosis a intervalos regulares. No tome su medicamento con una frecuencia mayor que la indicada. Hable con su pediatra para informarse acerca del uso de este medicamento en nios. Aunque este medicamento ha sido recetado a nios tan menores como de 10 aos de edad para condiciones selectivas, las precauciones se aplican. Sobredosis: Pngase en contacto inmediatamente con un centro toxicolgico o una sala de urgencia si usted cree que haya tomado demasiado medicamento. ATENCIN: [employer] es solo para usted. No comparta este medicamento con nadie. Qu sucede si me olvido de una dosis? Si olvida una dosis, tmela lo antes posible. Si es casi la hora de la prxima dosis, tome slo esa dosis. No tome dosis adicionales o dobles. Qu puede interactuar con este medicamento? No tome esta medicina con ninguno de los siguientes medicamentos: - IMAOs, tales como [place], Eldepryl, [place], [place] y Parnate Puede ser que esta lista no menciona todas las posibles interacciones.  Informe a su profesional de KB Home	[place] de [employer] productos a base de hierbas, medicamentos de [place] o suplementos nutritivos que est tomando. Si usted fuma, consume bebidas alcohlicas o si utiliza drogas ilegales, indqueselo tambin a su profesional de KB Home	[place]. Algunas sustancias pueden interactuar con su medicamento. A qu debo estar atento al usar Coca[employer]Cola? Si los sntomas no mejoran o si empeoran, consulte con su mdico. Si tiene fiebre alta, erupcin cutnea o dolor de [place], consulte con su profesional de la salud. Puede experimentar mareos o somnolencia. No conduzca ni utilice maquinaria ni haga nada que Associate Professor en estado de alerta hasta que sepa cmo le afecta este medicamento. No se siente ni se ponga de pie con rapidez, especialmente si es un paciente de edad avanzada. Esto reduce el riesgo de mareos o [employer]. Qu efectos secundarios puedo tener al [employer] este medicamento? Efectos secundarios que debe informar a su mdico o a Barrister's clerk de la salud tan pronto como sea posible: -Chief of Staff como erupcin cutnea o urticarias, hinchazn de la cara, labios o lengua -problemas respiratorios -dolor en el pecho -confusin o alucinaciones -pulso cardiaco irregular -entumecimiento de la boca o la garganta -convulsiones Efectos secundarios que, por lo general, no requieren atencin mdica (debe informarlos a su mdico o a su profesional de la salud si persisten o si son molestos): -sensacin de ardor en los ojos -estreimiento -dolor de cabeza -congestin nasal -Higher education careers adviser Puede ser que esta lista no menciona todos los posibles efectos secundarios. Comunquese a su mdico por asesoramiento mdico [employer]. Usted puede informar los efectos secundarios a la FDA por telfono al 1-800-FDA-1088. Dnde debo guardar mi medicina? Mantngala fuera del alcance de los  nios. Gurdela a temperatura ambiente, entre 15 y 76  grados C (46 y 5 grados F). Mantenga el envase bien cerrado. Protjala de la luz y la humedad. Deseche todo el medicamento que no haya utilizado, despus de la fecha de vencimiento. ATENCIN: Este folleto es un resumen. Puede ser que no cubra toda la posible informacin. Si usted tiene preguntas acerca de esta medicina, consulte con su mdico, su farmacutico o su profesional de Technical sales engineer.  2017 Elsevier/Gold Standard (2014-04-07 00:00:00) Tramadol tablets Qu es este medicamento? El TRAMADOL es un analgsico. Se utiliza para tratar dolores moderados o severos en adultos. Este medicamento puede ser utilizado para otros usos; si tiene alguna pregunta consulte con su proveedor de atencin mdica o con su farmacutico. MARCAS COMUNES: Ultram Qu le debo informar a mi profesional de la salud antes de tomar este medicamento? Necesita saber si usted presenta alguno de los [employer] o situaciones: -tumor cerebral -depresin -abuso de drogas o drogadiccin -lesin de la cabeza -si consume bebidas alcohlicas con frecuencia -enfermedad renal o problemas al orinar -enfermedad heptica -enfermedad pulmonar, asma o problemas respiratorios -convulsiones o epilepsia -ideas suicidas, planes o intento; si usted o alguien de su familia ha intentado un suicidio previo -una reaccin alrgica o inusual al tramadol, a la codena, a otros medicamentos, alimentos, colorantes o conservantes -si est embarazada o buscando quedar embarazada -si est amamantando a un beb Cmo debo utilizar este medicamento? Tome este medicamento por va oral con un vaso lleno de agua. Siga las instrucciones de la etiqueta del [place]. Si el Transport planner, tmelo con alimentos o con [place]. No tome su medicamento con una frecuencia mayor que la indicada. Hable con su pediatra para informarse acerca del uso de este medicamento en nios. Puede requerir atencin especial. Sobredosis: Pngase  en contacto inmediatamente con un centro toxicolgico o una sala de urgencia si usted cree que haya tomado demasiado medicamento. ATENCIN: [employer] es solo para usted. No comparta este medicamento con nadie. Qu sucede si me olvido de una dosis? Si olvida una dosis, tmela lo antes posible. Si es casi la hora de la prxima dosis, tome slo esa dosis. No tome dosis adicionales o dobles. Qu puede interactuar con este medicamento? No tome esta medicina con ninguno de los siguientes medicamentos: -IMAOs, tales como Carbex, Eldepryl, [place], [place] y Parnate Esta medicina tambin puede interactuar con los siguientes medicamentos: -alcohol o medicamentos que contienen alcohol -antihistamnicos -benzodiacepinas -bupropion -carbamazepina u oxcarbazepina -clozapina -ciclobenzaprina -digoxina -furazolidona -linezolid -medicamentos para la depresin, ansiedad o trastornos psicticos -medicamentos para las migraas, tales como almotriptn, eletriptn, frovatriptn, naratriptn, rizatriptn, sumatriptn, zolmitriptn -analgsicos, incluyendo pentazocina, buprenorfina, butorfanol, meperidina, nalbufina y propoxifeno -medicamentos para conciliar el sueo -relajantes musculares -naltrexona -fenobarbital -fenotiazinas, tales como perfenacina, tioridazina, clorpromacina, mesoridazina, flufenazina, proclorperazina, promazina y trifluoperazina -procarbazina -warfarina Puede ser que esta lista no menciona todas las posibles interacciones. Informe a su profesional de KB Home	[place] de [employer] productos a base de hierbas, medicamentos de [employer] o suplementos nutritivos que est tomando. Si usted fuma, consume bebidas alcohlicas o si utiliza drogas ilegales, indqueselo tambin a su profesional de KB Home	[place]. Algunas sustancias pueden interactuar con su medicamento. A qu debo estar atento al usar Coca[employer]Cola? Si el dolor no desaparece, si empeora o si experimenta un dolor nuevo o de tipo  diferente, consulte a su mdico o a su profesional de KB Home	[place]. Usted puede desarrollar tolerancia al medicamento. La tolerancia significa que necesitar una dosis ms alta para Theatre stage manager  dolor. Tolerancia es normal y esperada cuando est tomando este medicamento por un largo perodo de [place]. No suspenda el uso de su medicamento repentinamente debido a que puede Engineer, materials reaccin severa. Su cuerpo se acostumbra a Fish farm manager. Esto NO significa que sea adicto. La adiccin es un comportamiento que hace referencia a la obtencin y utilizacin de un medicamento con fines que no son mdicos. Si tiene Social research officer, government, existe una razn mdica para que usted tome un analgsico. Su mdico le indicar la cantidad de medicamento que Tree surgeon. Si su mdico desea que [employer], la dosis ser reducida gradualmente para Research officer, political party secundarios. Puede experimentar mareos o somnolencia. No conduzca ni utilice maquinaria, ni haga nada que Associate Professor en estado de alerta hasta que sepa cmo le afecta este medicamento. No se siente ni se ponga de pie con rapidez, especialmente si es un paciente de edad avanzada. Esto reduce el riesgo de mareos o [employer]. El alcohol puede aumentar o disminuir el efecto de este medicamento. Evite consumir bebidas alcohlicas. Este medicamento puede causar estreimiento. Trate de evacuar los intestinos al menos cada 2  3 das. Si no evacua los intestinos durante 3 das, comunquese con su mdico o con su profesional de KB Home	[place]. Se le podr secar la boca. Masticar chicle sin azcar, chupar caramelos duros y tomar agua en abundancia le ayudar a mantener la boca hmeda. Si el problema no desaparece o es severo, consulte a su mdico. Qu efectos secundarios puedo tener al [employer] este medicamento? Efectos secundarios que debe informar a su mdico o a Barrister's clerk de la salud tan pronto como sea posible: Chief of Staff, como erupcin cutnea,  comezn/picazn o urticarias, hinchazn de la cara, los labios o la lengua problemas respiratorios confusin convulsiones signos y sntomas de presin [place], tales como [place], sensacin de [place] o aturdimiento, cadas, cansancio o debilidad inusual dificultad para orinar o cambios en el volumen de orina Efectos secundarios que generalmente no requieren atencin mdica (debe informarlos a su mdico o a su profesional de la salud si persisten o si son molestos): estreimiento boca seca nuseas, vmito cansancio Puede ser que esta lista no menciona todos los posibles efectos secundarios. Comunquese a su mdico por asesoramiento mdico [employer]. Usted puede informar los efectos secundarios a la FDA por telfono al 1-800-FDA-1088. Dnde debo guardar mi medicina? Mantngala fuera del alcance de los nios. Gurdela a [employer], entre 15 y 42 grados C (75 y 6 grados F). Mantenga el envase bien cerrado. Deseche los medicamentos que no haya utilizado, despus de la fecha de vencimiento. ATENCIN: Este folleto es un resumen. Puede ser que no cubra toda la posible informacin. Si usted tiene preguntas acerca de esta medicina, consulte con su mdico, su farmacutico o su profesional de Technical sales engineer.  2017 Elsevier/Gold Standard (2014-12-16 00:00:00) Sinusitis en los adultos (Sinusitis, Adult) La sinusitis es la inflamacin y Conservation officer, historic buildings en los senos paranasales. Los senos paranasales son espacios vacos en los huesos alrededor del rostro. Estos se encuentran en estos lugares:  Alrededor de los ojos.  En la mitad de la frente.  Detrs de Mudlogger.  En los pmulos. Los senos y las fosas nasales estn cubiertos de un lquido fibroso (mucosidad). Normalmente, la mucosidad drena a travs de los senos. Cuando los tejidos nasales se inflaman o hinchan, la mucosidad puede quedar atrapada o bloqueada, por lo que el aire no puede fluir por los senos paranasales. Esto permite  que se desarrollen bacterias,  virus y hongos, lo que produce infecciones. [place], use o aplique los medicamentos de venta libre y los recetados solamente como se lo haya indicado el mdico. Estos pueden incluir aerosoles nasales.  Si le recetaron un antibitico, tmelo como se lo haya indicado el mdico. No deje de tomar los antibiticos aunque comience a Sports administrator. Hidrtese y humidifique los ambientes   Beba suficiente agua para Theatre manager la orina clara o de color amarillo plido.  Use un humidificador de vapor fro para mantener la humedad de su hogar por encima del 50%.  Inhale vapor durante 10a 11minutos, de 3a 4veces al da o como se lo haya indicado el mdico. Puede hacer esto en el bao con el vapor del agua caliente de la ducha.  Trate de no exponerse al aire fro o seco. Reposo   Descanse todo lo que pueda.  Duerma con la cabeza elevada.  Asegrese de dormir lo suficiente cada noche. Instrucciones generales   Pngase un pao caliente y hmedo en el rostro 3o 4veces al da, o como se lo haya indicado su mdico. Esto ayuda a Building services engineer.  Lave sus manos frecuentemente con agua y [place]. Use un desinfectante para manos si no dispone de [place] y [place].  No fume. Evite estar cerca de personas que fuman (fumador pasivo).  Concurra a todas las visitas de control como se lo haya indicado el mdico. Esto es importante. SOLICITE AYUDA SI:  Tiene fiebre.  Los sntomas empeoran.  Los sntomas no mejoran en el perodo de 10das. SOLICITE AYUDA DE INMEDIATO SI:  Siente un dolor de cabeza muy intenso.  No puede dejar de vomitar.  Tiene dolor o hinchazn en la zona del rostro o los ojos.  Tiene dificultad para ver.  Se siente confundido.  Tiene el cuello rgido.  Tiene dificultad para respirar. Esta informacin no tiene Marine scientist el consejo del mdico. Asegrese de hacerle al mdico cualquier pregunta que  tenga. Document Released: 11/08/2011 Document Revised: 06/07/2015 Document Reviewed: 12/09/2014 Elsevier Interactive Patient Education  2017 [employer].

## 2016-03-02 NOTE — Progress Notes (Signed)
Office Visit Note   Patient: Sandra Benson           Date of Birth: 03/25/1965           MRN: [id number] Visit Date: 03/02/2016              Requested by: Alfonse Spruce, [address] [place], [place] [address] PCP: Fredia Beets, FNP   Assessment & Plan: Visit Diagnoses:  1. Impingement syndrome of left shoulder   2. Primary osteoarthritis of right hip     Plan: Impression is right hip degenerative joint disease and left shoulder impingement syndrome. We discussed cortisone injection which she declined. She will like to start with oral medicines first. I gave her prescription for prednisone taper, diclofenac, Robaxin as needed. If the patient is not better would recommend coming back to see me.  Follow-Up Instructions: Return if symptoms worsen or fail to improve.   Orders:  Orders Placed This Encounter  Procedures  . XR Shoulder Left  . XR HIP UNILAT W OR W/O PELVIS 2-3 VIEWS RIGHT   Meds ordered this encounter  Medications  . DISCONTD: predniSONE (STERAPRED UNI-PAK 21 TAB) 10 MG (21) TBPK tablet    Sig: Take as directed    Dispense:  21 tablet    Refill:  0  . diclofenac (VOLTAREN) 75 MG EC tablet    Sig: Take 1 tablet (75 mg total) by mouth 2 (two) times daily.    Dispense:  30 tablet    Refill:  2  . methocarbamol (ROBAXIN) 500 MG tablet    Sig: Take 1 tablet (500 mg total) by mouth every 6 (six) hours as needed for muscle spasms.    Dispense:  30 tablet    Refill:  2      Procedures: No procedures performed   Clinical Data: No additional findings.   Subjective: Chief Complaint  Patient presents with  . Left Shoulder - Pain  . Right Hip - Pain    Patient is a 51 year old female who is Spanish-speaking only who comes in with 2 complaints. First complaint is her left shoulder pain which radiates up into the neck and down into the elbow. This is been hurting her for many months. She denies any radicular symptoms. The pain is worse with  activity and better with rest. She has not taken anything other than by mouth NSAIDs. Her right hip pain radiates down into the thigh. She denies groin pain but endorses upper buttock and lower back pain and right hip pain. She denies any mechanical symptoms. Again pain is worse with activity and better with rest. Denies any injuries. Denies any constitutional symptoms.    Review of Systems Complete review of systems is negative except for history of present illness  Objective: Vital Signs: LMP 10/01/2012   Physical Exam Well-developed nourished acute distress alert and oriented 3 extraocular movements intact trachea midline nonlabored breathing no audible thrills abdomen soft no lymphadenopathy normal judgments affect no skin lesions Ortho Exam Exam of the left shoulder shows positive impingement signs with pain with rotator cuff testing but rotator cuff is grossly intact. Positive cross adduction sign. Negative Spurling's. Exam of the right hip shows good range of motion of the hip. No sciatic tension signs. Positive Stinchfield sign. Negative Faber test. Specialty Comments:  No specialty comments available.  Imaging: Xr Hip Unilat W Or W/o Pelvis 2-3 Views Right  Result Date: 03/02/2016 Moderate DJD right hip  Xr Shoulder Left  Result Date:  03/02/2016 AC joint arthropathy.  No GH DJD.    PMFS History: Patient Active Problem List   Diagnosis Date Noted  . Impingement syndrome of left shoulder 03/02/2016  . Primary osteoarthritis of right hip 03/02/2016  . Chest pain 11/05/2013   Past Medical History:  Diagnosis Date  . Fibroids     Family History  Problem Relation Age of Onset  . Diabetes Mother   . Diabetes Brother     Past Surgical History:  Procedure Laterality Date  . ABDOMINAL HYSTERECTOMY N/A 10/22/2012   Procedure: HYSTERECTOMY ABDOMINAL;  Surgeon: Jonnie Kind, MD;  Location: [facility];  Service: Gynecology;  Laterality: N/A;  . TUBAL LIGATION     Social  History   Occupational History  . Not on file.   Social History Main Topics  . Smoking status: Never Smoker  . Smokeless tobacco: Never Used  . Alcohol use No  . Drug use: No  . Sexual activity: Yes    Birth control/ protection: Surgical

## 2016-03-02 NOTE — Progress Notes (Signed)
Subjective:  Patient ID: Sandra Benson, female    DOB: Jun 03, 1965  Age: 51 y.o. MRN: [id number]  CC: No chief complaint on file.   HPI Sandra Benson presents for cold symptoms and dental problem. She reports runny nose with green nasal drainage, dry cough, and hoarseness since last Friday. Reports left frontal and maxillary sinus pressure. She denies any fever, wheezing or SOB. Non-smoker. Denies taking anything to help relieve symptoms. Reports dental pain and loose teeth to lower bilateral molars. Pain 7/10. Denies any swelling or drainage.   Outpatient Medications Prior to Visit  Medication Sig Dispense Refill  . amLODipine (NORVASC) 5 MG tablet Take 1 tablet (5 mg total) by mouth daily. (Patient not taking: Reported on 03/02/2016) 30 tablet 3  . aspirin 81 MG tablet Take 81 mg by mouth daily.    . diclofenac (VOLTAREN) 75 MG EC tablet Take 1 tablet (75 mg total) by mouth 2 (two) times daily. 30 tablet 2  . hydrochlorothiazide (MICROZIDE) 12.5 MG capsule Take 1 capsule (12.5 mg total) by mouth daily. (Patient not taking: Reported on 03/02/2016) 30 capsule 3  . ibuprofen (ADVIL,MOTRIN) 400 MG tablet Take 1 tablet (400 mg total) by mouth every 6 (six) hours as needed (Take with food.). (Patient not taking: Reported on 03/02/2016) 30 tablet 0  . methocarbamol (ROBAXIN) 500 MG tablet Take 1 tablet (500 mg total) by mouth every 6 (six) hours as needed for muscle spasms. 30 tablet 2  . Multiple Vitamin (MULTIVITAMIN WITH MINERALS) TABS tablet Take 1 tablet by mouth daily.    . nitrofurantoin, macrocrystal-monohydrate, (MACROBID) 100 MG capsule Take 1 capsule (100 mg total) by mouth 2 (two) times daily. (Patient not taking: Reported on 03/02/2016) 6 capsule 0  . omega-3 acid ethyl esters (LOVAZA) 1 G capsule Take 2 capsules (2 g total) by mouth 2 (two) times daily. (Patient not taking: Reported on 03/02/2016) 180 capsule 5  . omeprazole (PRILOSEC) 20 MG capsule Take 1 capsule (20 mg total) by mouth daily.  (Patient not taking: Reported on 03/02/2016) 30 capsule 0  . predniSONE (STERAPRED UNI-PAK 21 TAB) 10 MG (21) TBPK tablet Take as directed 21 tablet 0   No facility-administered medications prior to visit.    ROS Review of Systems  Constitutional: Negative for fever.  HENT: Positive for dental problem, rhinorrhea, sinus pain and sinus pressure. Negative for facial swelling, sneezing, sore throat and trouble swallowing.   Respiratory: Negative.   Cardiovascular: Negative.   Gastrointestinal: Negative.    Objective:  LMP 10/01/2012   BP/Weight 02/04/2016 09/30/2015 0000000  Systolic BP [id number]  Diastolic BP 80 86 82  Wt. (Lbs) 181 182 -  BMI 34.2 34.39 -   Physical Exam  HENT:  Right Ear: Hearing, external ear and ear canal normal. Tympanic membrane is erythematous.  Left Ear: Hearing normal. There is tenderness. Tympanic membrane is erythematous.  Nose: Mucosal edema and rhinorrhea present. Right sinus exhibits no frontal sinus tenderness. Left sinus exhibits maxillary sinus tenderness and frontal sinus tenderness.  Mouth/Throat: Uvula is midline, oropharynx is clear and moist and mucous membranes are normal. Dental caries (bilateral lower molars) present. No dental abscesses.  Eyes: Conjunctivae are normal. Pupils are equal, round, and reactive to light. Right eye exhibits no discharge. Left eye exhibits no discharge.  Cardiovascular: Normal rate, regular rhythm and normal heart sounds.   Pulmonary/Chest: Effort normal and breath sounds normal.  Abdominal: Soft. Bowel sounds are normal.  Lymphadenopathy:    She has cervical adenopathy (  left submandibular tenderness).   Assessment & Plan:   Problem List Items Addressed This Visit    None    Visit Diagnoses    Dental caries    -  Primary   Relevant Orders   Ambulatory referral to Dentistry   Acute non-recurrent sinusitis, unspecified location       Relevant Medications   amoxicillin-clavulanate (AUGMENTIN) 875-125 MG  tablet   benzonatate (TESSALON) 100 MG capsule   fluticasone (FLONASE) 50 MCG/ACT nasal spray   Other Relevant Orders   Ambulatory referral to Dentistry   Cough       Relevant Medications   benzonatate (TESSALON) 100 MG capsule   Pain due to dental caries       Relevant Medications   traMADol (ULTRAM) 50 MG tablet   Loose, teeth         Meds ordered this encounter  Medications  . amoxicillin-clavulanate (AUGMENTIN) 875-125 MG tablet    Sig: Take 1 tablet by mouth 2 (two) times daily.    Dispense:  14 tablet    Refill:  0    Order Specific Question:   Supervising Provider    Answer:   Tresa Garter [id number]  . benzonatate (TESSALON) 100 MG capsule    Sig: Take 1 capsule (100 mg total) by mouth 3 (three) times daily as needed for cough.    Dispense:  20 capsule    Refill:  0    Order Specific Question:   Supervising Provider    Answer:   Tresa Garter [id number]  . fluticasone (FLONASE) 50 MCG/ACT nasal spray    Sig: Place 2 sprays into both nostrils daily. For 7 days.    Dispense:  16 g    Refill:  0    Order Specific Question:   Supervising Provider    Answer:   Tresa Garter [id number]  . traMADol (ULTRAM) 50 MG tablet    Sig: Take 1 tablet (50 mg total) by mouth every 8 (eight) hours as needed for moderate pain or severe pain.    Dispense:  30 tablet    Refill:  0    Order Specific Question:   Supervising Provider    Answer:   Tresa Garter [id number]    Follow-up: Return if symptoms worsen or fail to improve.   Alfonse Spruce FNP

## 2016-03-02 NOTE — Progress Notes (Signed)
Patient is here for Cold Sx and Dental Referral  Patient denies pain at this time  Patient complains of cough, congestion and hoarseness being present since last Friday. Patient has used OTC treatment with no relief.  Patient denies pain at this time.  Patient has not taken any medication today. Patient has eaten today.  Patient declined flu vaccine due to cold sx being present.

## 2016-03-13 ENCOUNTER — Ambulatory Visit
Admission: RE | Admit: 2016-03-13 | Discharge: 2016-03-13 | Disposition: A | Discharge status: Home / Self Care | Payer: No Typology Code available for payment source | Source: Ambulatory Visit | Attending: Family Medicine | Admitting: Family Medicine

## 2016-03-13 ENCOUNTER — Ambulatory Visit: Payer: Self-pay | Admitting: Family Medicine

## 2016-03-13 DIAGNOSIS — Z0000 Encounter for general adult medical examination without abnormal findings: Secondary | ICD-10-CM

## 2017-04-25 ENCOUNTER — Other Ambulatory Visit: Payer: Self-pay | Admitting: Family Medicine

## 2017-04-25 ENCOUNTER — Other Ambulatory Visit: Payer: Self-pay | Admitting: Obstetrics and Gynecology

## 2017-04-25 DIAGNOSIS — Z1231 Encounter for screening mammogram for malignant neoplasm of breast: Secondary | ICD-10-CM

## 2017-05-17 ENCOUNTER — Encounter (HOSPITAL_COMMUNITY): Payer: Self-pay

## 2017-05-17 ENCOUNTER — Ambulatory Visit
Admission: RE | Admit: 2017-05-17 | Discharge: 2017-05-17 | Disposition: A | Discharge status: Home / Self Care | Payer: No Typology Code available for payment source | Source: Ambulatory Visit | Attending: Obstetrics and Gynecology | Admitting: Obstetrics and Gynecology

## 2017-05-17 ENCOUNTER — Ambulatory Visit (HOSPITAL_COMMUNITY)
Admission: RE | Admit: 2017-05-17 | Discharge: 2017-05-17 | Disposition: A | Discharge status: Home / Self Care | Payer: Self-pay | Source: Ambulatory Visit | Attending: Obstetrics and Gynecology | Admitting: Obstetrics and Gynecology

## 2017-05-17 VITALS — BP 120/72 | Ht 61.0 in

## 2017-05-17 DIAGNOSIS — Z1231 Encounter for screening mammogram for malignant neoplasm of breast: Secondary | ICD-10-CM

## 2017-05-17 DIAGNOSIS — Z1239 Encounter for other screening for malignant neoplasm of breast: Secondary | ICD-10-CM

## 2017-05-17 NOTE — Progress Notes (Signed)
No complaints today.   Pap Smear: Pap smear not completed today. Last Pap smear was 09/19/2012 at [facility] and normal with negative HPV. Per patient has no history of an abnormal Pap smear. Patient has a history of a hysterectomy in 2014 due to fibroids. Patient no longer needs Pap smears due to her history of a hysterectomy for benign reasons per BCCCP and ACOG guidelines. Last Pap smear result is in Epic.  Physical exam: Breasts Breasts symmetrical. No skin abnormalities bilateral breasts. No nipple retraction bilateral breasts. No nipple discharge bilateral breasts. No lymphadenopathy. No lumps palpated bilateral breasts. No complaints of pain or tenderness on exam. Referred patient to the [facility] for a screening mammogram. Appointment scheduled for Thursday, May 17, 2017 at 1540.        Pelvic/Bimanual No Pap smear completed today since last Pap smear and HPV typing was 09/19/2012 and patient has a history of a hysterectomy for benign reasons. Pap smear not indicated per BCCCP guidelines.   Smoking History: Patient has never smoked.  Patient Navigation: Patient education provided. Access to services provided for patient through [facility] program. Spanish interpreter provided.   Colorectal Cancer Screening: Per patient has never had a colonoscopy completed. No complaints today. FIT Test given to patient to complete and return to BCCCP.  Breast and Cervical Cancer Risk Assessment: Patient has no family history of breast cancer, known genetic mutations, or radiation treatment to the chest before age 39. Patient has no history of cervical dysplasia, immunocompromised, or DES exposure in-utero.  Used Spanish interpreter [employer] from [place].

## 2017-05-17 NOTE — Patient Instructions (Signed)
Explained breast self awareness with Sandra Benson. Patient did not need a Pap smear today due to her history of a hysterectomy for benign reasons. Explained to patient that she no longer needs Pap smears due to her history of a hysterectomy for benign reasons. Referred patient to the [facility] for a screening mammogram. Appointment scheduled for Thursday, May 17, 2017 at 1540. Let [facility] know [facility] will follow up with her within the next couple weeks with results of mammogram by letter or phone. Sandra Benson verbalized understanding.  Brannock, Arvil Chaco, RN 3:25 PM

## 2017-05-22 ENCOUNTER — Encounter (HOSPITAL_COMMUNITY): Payer: Self-pay | Admitting: *Deleted

## 2017-05-30 ENCOUNTER — Other Ambulatory Visit: Payer: Self-pay

## 2017-06-07 LAB — FECAL OCCULT BLOOD, IMMUNOCHEMICAL: FECAL OCCULT BLD: NEGATIVE

## 2017-06-18 ENCOUNTER — Encounter (HOSPITAL_COMMUNITY): Payer: Self-pay

## 2018-01-29 MED FILL — ONDANSETRON HCL 4 MG TABLET: 4 | 5 days supply | Qty: 15 | Fill #0

## 2018-04-28 IMAGING — CT CT ABD-PELV W/ CM
2 of 5 series · 16 of 46 positions shown, 18 images · IV contrast (APPLIED)
Comparison: 08/27/2012

CLINICAL DATA: Right lower quadrant abdominal pain.

EXAM:
CT ABDOMEN AND PELVIS WITH CONTRAST
TECHNIQUE: Multidetector CT imaging of the abdomen and pelvis was performed
using the standard protocol following bolus administration of
intravenous contrast.
CONTRAST:  100mL SNI67G-EAA IOPAMIDOL (SNI67G-EAA) INJECTION 61%,
30mL SNI67G-EAA IOPAMIDOL (SNI67G-EAA) INJECTION 61%

[Series 2: axial st · axial · 0.80mm/px · z∈[-731,-341]mm · 13 of 88 slices shown, 15 images]
[im 5/88  soft-tissue]
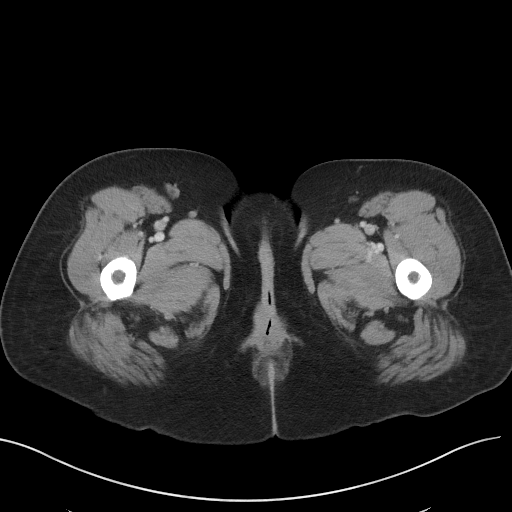
[im 5/88  bone]
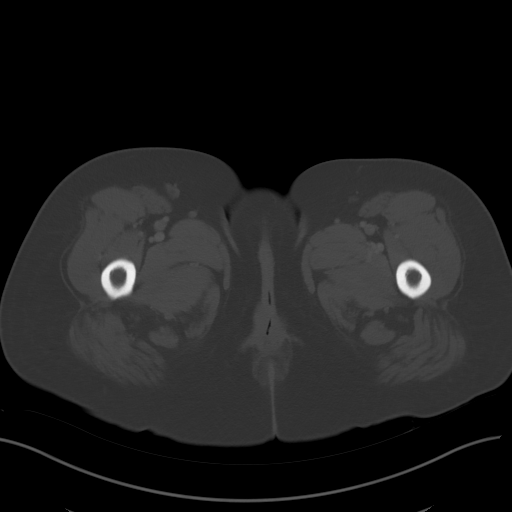
[im 10/88  soft-tissue]
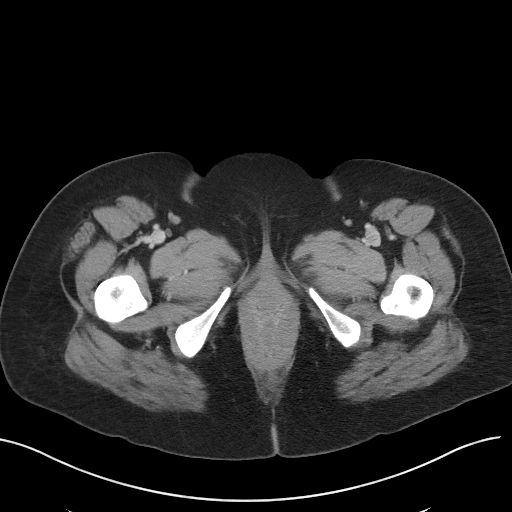
[im 20/88  soft-tissue]
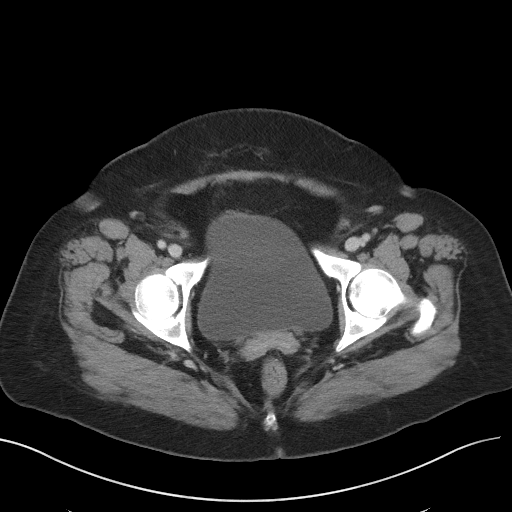
[im 25/88  soft-tissue]
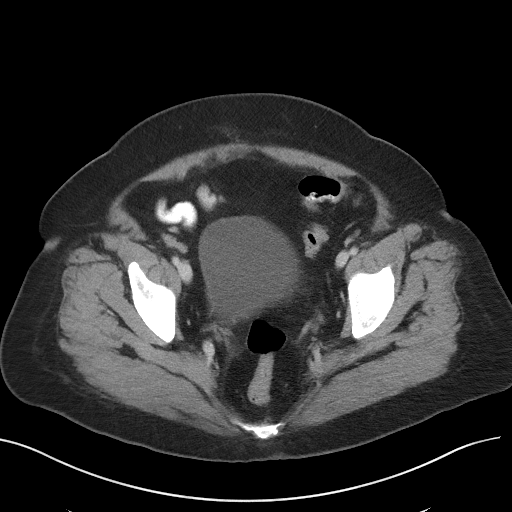
[im 30/88  soft-tissue]
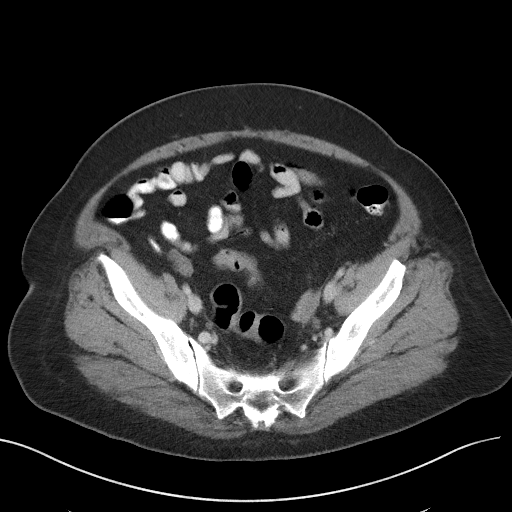
[im 39/88  soft-tissue]
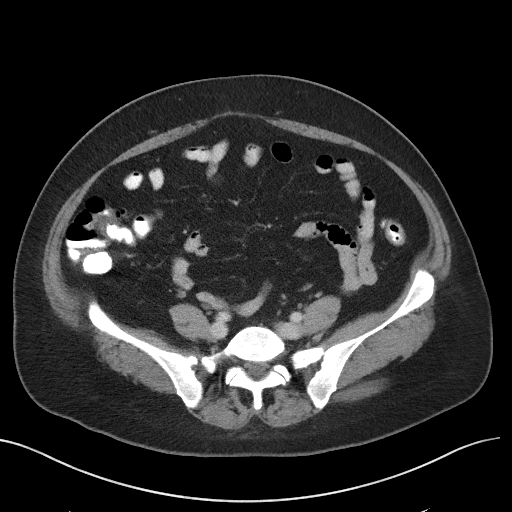
[im 44/88  soft-tissue]
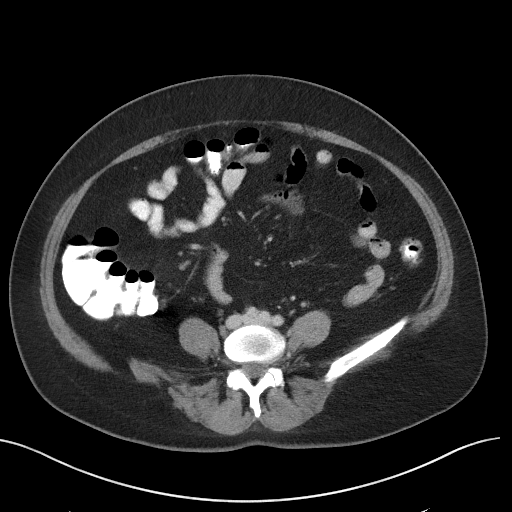
[im 49/88  soft-tissue]
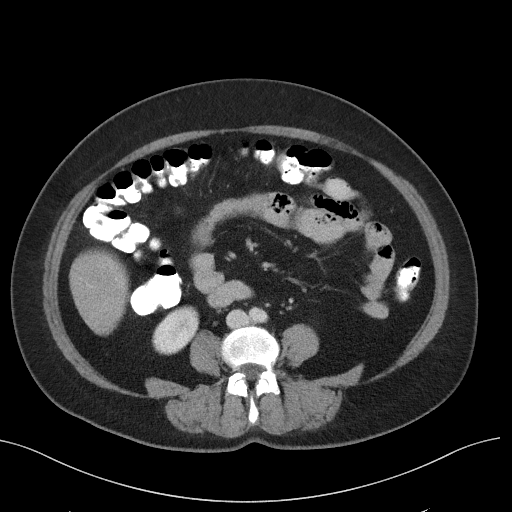
[im 59/88  soft-tissue]
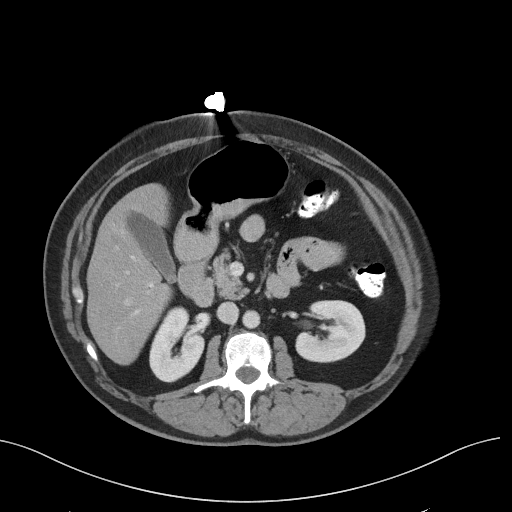
[im 59/88  bone]
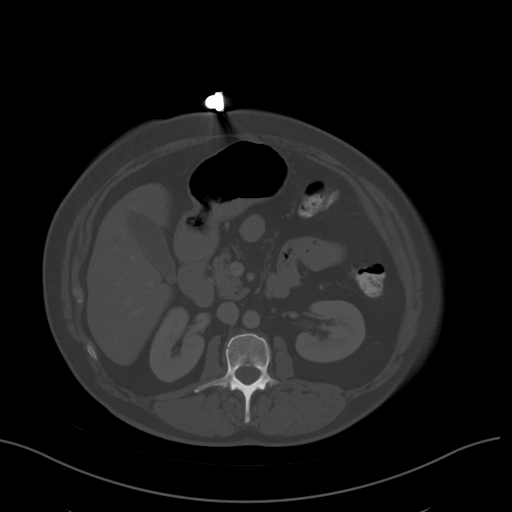
[im 63/88  soft-tissue]
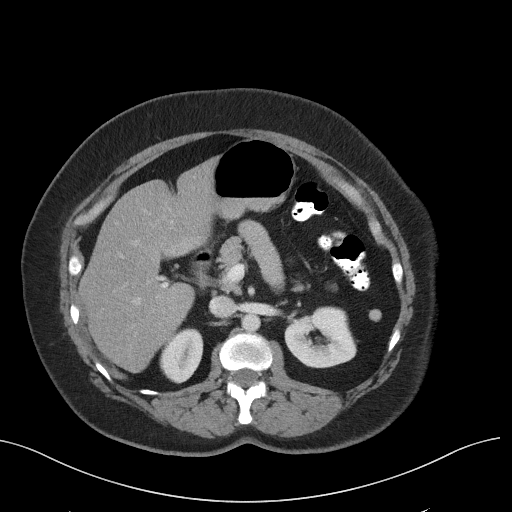
[im 68/88  soft-tissue]
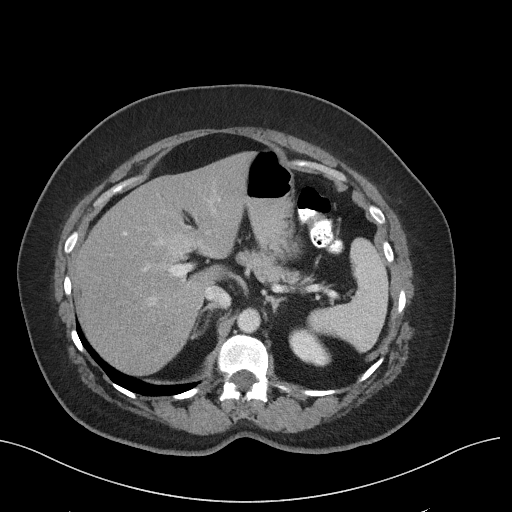
[im 78/88  soft-tissue]
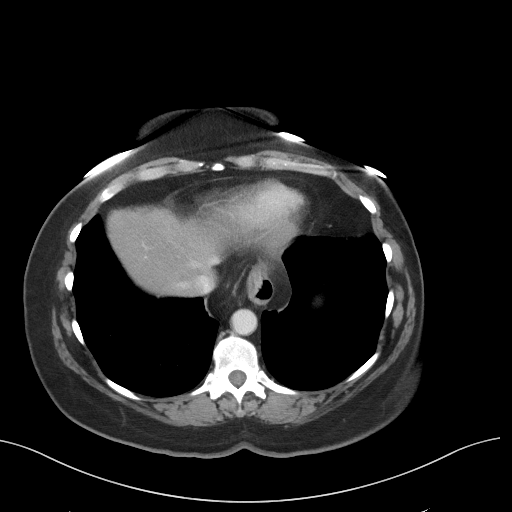
[im 83/88  soft-tissue]
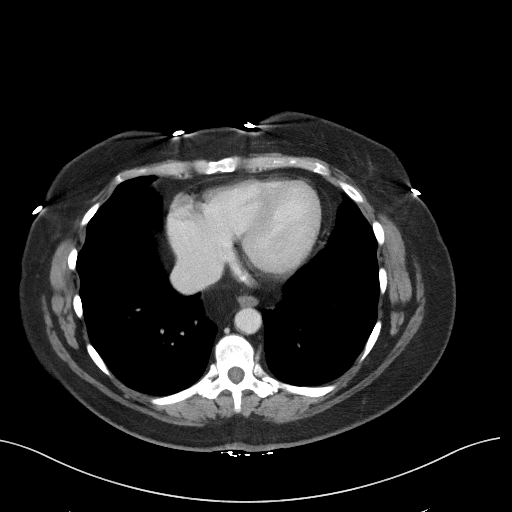

[Series 4: coronal st · coronal · 0.73mm/px · 3 of 101 slices shown]
[im 34/101  soft-tissue]
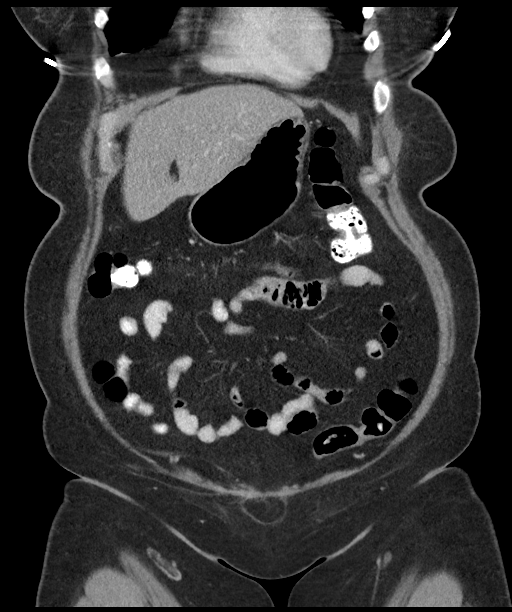
[im 45/101  soft-tissue]
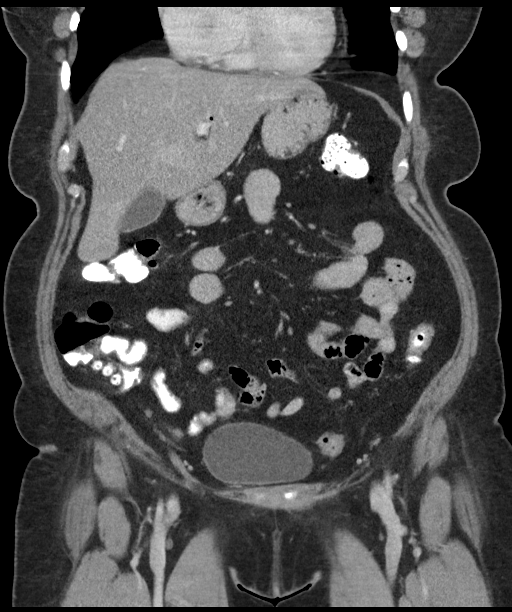
[im 56/101  soft-tissue]
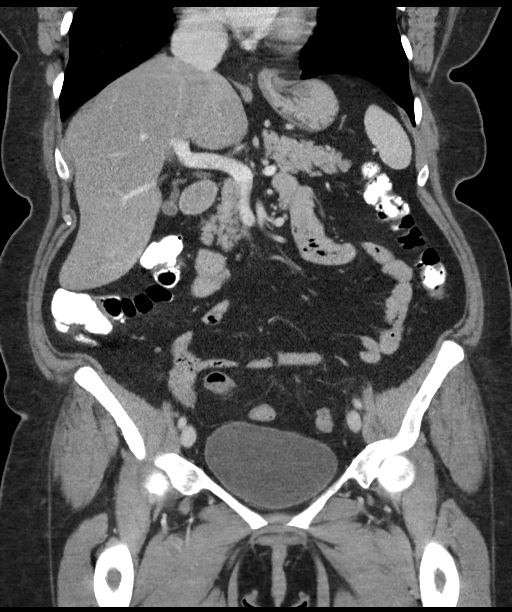

[16 of 46 positions shown; findings below may reference images not displayed]

FINDINGS: Lower chest: Stable calcified granuloma at the base of the lingula.
Otherwise, the lung bases are clear.

Hepatobiliary: Low-attenuation in the liver with an area of fat
sparing along the posterior left hepatic lobe. Portal venous system
is patent. No suspicious liver lesion. Normal appearance of the
gallbladder without biliary dilatation.

Pancreas: Normal appearance of the pancreas without inflammation or
duct dilatation.

Spleen: Normal appearance of spleen without enlargement.

Adrenals/Urinary Tract: Normal adrenal glands. Normal appearance of
both kidneys without hydronephrosis. Urinary bladder is
unremarkable. Tiny cyst in left kidney lower pole.

Stomach/Bowel: Small hiatal hernia. Normal appearance of the colon
and appendix. No evidence for bowel distention or focal bowel
inflammation.

Vascular/Lymphatic: No significant vascular findings are present. No
enlarged abdominal or pelvic lymph nodes.

Reproductive: Status post hysterectomy. No adnexal masses.

Other: No free fluid. No free air. Postsurgical changes along the
anterior pelvic wall. There is a ventral hernia in the suprapubic
region containing fat.

Musculoskeletal: No acute bone abnormality.
IMPRESSION: No acute abnormality in the abdomen or pelvis.

Hepatic steatosis.

Postsurgical changes in the anterior pelvis with a ventral hernia in
the suprapubic region.

## 2019-02-19 ENCOUNTER — Encounter (HOSPITAL_COMMUNITY): Payer: Self-pay | Admitting: Emergency Medicine

## 2019-02-19 ENCOUNTER — Emergency Department (HOSPITAL_COMMUNITY)
Admission: EM | Admit: 2019-02-19 | Discharge: 2019-02-19 | Disposition: A | Discharge status: Home / Self Care | Payer: No Typology Code available for payment source | Attending: Emergency Medicine | Admitting: Emergency Medicine

## 2019-02-19 ENCOUNTER — Other Ambulatory Visit: Payer: Self-pay

## 2019-02-19 DIAGNOSIS — Z79899 Other long term (current) drug therapy: Secondary | ICD-10-CM | POA: Insufficient documentation

## 2019-02-19 DIAGNOSIS — Z7982 Long term (current) use of aspirin: Secondary | ICD-10-CM | POA: Insufficient documentation

## 2019-02-19 DIAGNOSIS — J069 Acute upper respiratory infection, unspecified: Secondary | ICD-10-CM | POA: Insufficient documentation

## 2019-02-19 DIAGNOSIS — R519 Headache, unspecified: Secondary | ICD-10-CM | POA: Insufficient documentation

## 2019-02-19 DIAGNOSIS — I10 Essential (primary) hypertension: Secondary | ICD-10-CM | POA: Insufficient documentation

## 2019-02-19 HISTORY — DX: Essential (primary) hypertension: I10

## 2019-02-19 LAB — COMPREHENSIVE METABOLIC PANEL
ALT: 19 U/L (ref 0–44)
AST: 19 U/L (ref 15–41)
Albumin: 3.5 g/dL (ref 3.5–5.0)
Alkaline Phosphatase: 85 U/L (ref 38–126)
Anion gap: 9 (ref 5–15)
BUN: 12 mg/dL (ref 6–20)
CO2: 25 mmol/L (ref 22–32)
Calcium: 8.8 mg/dL — ABNORMAL LOW (ref 8.9–10.3)
Chloride: 103 mmol/L (ref 98–111)
Creatinine, Ser: 0.7 mg/dL (ref 0.44–1.00)
GFR calc Af Amer: >60 mL/min (ref >60)
GFR calc non Af Amer: >60 mL/min (ref >60)
Glucose, Bld: 154 mg/dL — ABNORMAL HIGH (ref 70–99)
Potassium: 3.5 mmol/L (ref 3.5–5.1)
Sodium: 137 mmol/L (ref 135–145)
Total Bilirubin: 1.1 mg/dL (ref 0.3–1.2)
Total Protein: 6.9 g/dL (ref 6.5–8.1)

## 2019-02-19 LAB — CBC
HCT: 42.1 % (ref 36.0–46.0)
Hemoglobin: 13.4 g/dL (ref 12.0–15.0)
MCH: 29.3 pg (ref 26.0–34.0)
MCHC: 31.8 g/dL (ref 30.0–36.0)
MCV: 91.9 fL (ref 80.0–100.0)
Platelets: 243 10*3/uL (ref 150–400)
RBC: 4.58 MIL/uL (ref 3.87–5.11)
RDW: 12.7 % (ref 11.5–15.5)
WBC: 8.1 10*3/uL (ref 4.0–10.5)
nRBC: 0 % (ref 0.0–0.2)

## 2019-02-19 LAB — LIPASE, BLOOD: Lipase: 16 U/L (ref 11–51)

## 2019-02-19 MED ORDER — SODIUM CHLORIDE 0.9% FLUSH: 3.0000 mL | Freq: Once | INTRAVENOUS | Status: DC

## 2019-02-19 MED ORDER — AZITHROMYCIN 250 MG PO TABS: 250.0000 mg | ORAL_TABLET | Freq: Every day | ORAL | 0 refills | Status: AC

## 2019-02-19 NOTE — ED Triage Notes (Signed)
Patient report headache and "feeling a little SOB." O2 sat in Triage 100. Patient states her headache started Dec 12. Reports nausea and diarrhea yesterday but no vomiting.

## 2019-02-19 NOTE — ED Provider Notes (Signed)
[facility] EMERGENCY DEPARTMENT Provider Note   CSN: [id number] Arrival date & time: 02/19/19  1436     History Chief Complaint  Patient presents with  . Headache    Sandra Benson is a 53 y.o. female.  Patient is a 53 year old female with history of hypertension and fibroids.  She presents today for evaluation of headache, cough, and feeling short of breath.  This is been present for the past 10 days.  Patient reports family members at home with previous diagnosis of COVID-19.  The history is provided by the patient.  Headache Pain location:  Frontal Quality:  Dull Radiates to:  Does not radiate Onset quality:  Gradual Duration:  10 days Timing:  Constant Progression:  Worsening Chronicity:  New      Past Medical History:  Diagnosis Date  . Fibroids   . Hypertension     Patient Active Problem List   Diagnosis Date Noted  . Impingement syndrome of left shoulder 03/02/2016  . Primary osteoarthritis of right hip 03/02/2016  . Chest pain 11/05/2013    Past Surgical History:  Procedure Laterality Date  . ABDOMINAL HYSTERECTOMY N/A 10/22/2012   Procedure: HYSTERECTOMY ABDOMINAL;  Surgeon: Jonnie Kind, MD;  Location: [facility];  Service: Gynecology;  Laterality: N/A;  . TUBAL LIGATION       OB History    Gravida  4   Para  4   Term      Preterm      AB      Living  4     SAB      TAB      Ectopic      Multiple      Live Births  4           Family History  Problem Relation Age of Onset  . Diabetes Mother   . Diabetes Brother     Social History   Tobacco Use  . Smoking status: Never Smoker  . Smokeless tobacco: Never Used  Substance Use Topics  . Alcohol use: No  . Drug use: No    Home Medications Prior to Admission medications   Medication Sig Start Date End Date Taking? Authorizing Provider  amLODipine (NORVASC) 5 MG tablet Take 1 tablet (5 mg total) by mouth daily. Patient not taking: Reported on 03/02/2016 12/31/14   Lance Bosch, NP  aspirin 81 MG tablet Take 81 mg by mouth daily.    [provider]  benzonatate (TESSALON) 100 MG capsule Take 1 capsule (100 mg total) by mouth 3 (three) times daily as needed for cough. Patient not taking: Reported on 05/17/2017 03/02/16   Alfonse Spruce, FNP  diclofenac (VOLTAREN) 75 MG EC tablet Take 1 tablet (75 mg total) by mouth 2 (two) times daily. Patient not taking: Reported on 03/02/2016 03/02/16   Leandrew Koyanagi, MD  fluticasone [facility]) 50 MCG/ACT nasal spray Place 2 sprays into both nostrils daily. For 7 days. Patient not taking: Reported on 05/17/2017 03/02/16   Alfonse Spruce, FNP  hydrochlorothiazide (MICROZIDE) 12.5 MG capsule Take 1 capsule (12.5 mg total) by mouth daily. Patient not taking: Reported on 03/02/2016 09/30/15   Argentina Donovan, PA-C  ibuprofen (ADVIL,MOTRIN) 400 MG tablet Take 1 tablet (400 mg total) by mouth every 6 (six) hours as needed (Take with food.). Patient not taking: Reported on 03/02/2016 02/04/16   Alfonse Spruce, FNP  methocarbamol (ROBAXIN) 500 MG tablet Take 1 tablet (500 mg total) by mouth  every 6 (six) hours as needed for muscle spasms. Patient not taking: Reported on 03/02/2016 03/02/16   Leandrew Koyanagi, MD  Multiple Vitamin (MULTIVITAMIN WITH MINERALS) TABS tablet Take 1 tablet by mouth daily.    [provider]  omega-3 acid ethyl esters (LOVAZA) 1 G capsule Take 2 capsules (2 g total) by mouth 2 (two) times daily. Patient not taking: Reported on 03/02/2016 01/19/15   Lance Bosch, NP  omeprazole (PRILOSEC) 20 MG capsule Take 1 capsule (20 mg total) by mouth daily. Patient not taking: Reported on 03/02/2016 02/04/16   Alfonse Spruce, FNP  traMADol (ULTRAM) 50 MG tablet Take 1 tablet (50 mg total) by mouth every 8 (eight) hours as needed for moderate pain or severe pain. Patient not taking: Reported on 05/17/2017 03/02/16   Alfonse Spruce, FNP    Allergies    Patient has no known allergies.  Review of  Systems   Review of Systems  Neurological: Positive for headaches.  All other systems reviewed and are negative.   Physical Exam Updated Vital Signs BP (!) 151/75 (BP Location: Right Arm)   Pulse 60   Temp 98.5 F (36.9 C) (Oral)   Resp 18   Ht 5' (1.524 m)   Wt 79.4 kg   LMP 10/01/2012   SpO2 96%   BMI 34.18 kg/m   Physical Exam Vitals and nursing note reviewed.  Constitutional:      General: She is not in acute distress.    Appearance: She is well-developed. She is not diaphoretic.  HENT:     Head: Normocephalic and atraumatic.  Eyes:     Extraocular Movements: Extraocular movements intact.     Pupils: Pupils are equal, round, and reactive to light.  Cardiovascular:     Rate and Rhythm: Normal rate and regular rhythm.     Heart sounds: No murmur. No friction rub. No gallop.   Pulmonary:     Effort: Pulmonary effort is normal. No respiratory distress.     Breath sounds: Normal breath sounds. No wheezing.  Abdominal:     General: Bowel sounds are normal. There is no distension.     Palpations: Abdomen is soft.     Tenderness: There is no abdominal tenderness.  Musculoskeletal:        General: Normal range of motion.     Cervical back: Normal range of motion and neck supple.  Skin:    General: Skin is warm and dry.  Neurological:     Mental Status: She is alert and oriented to person, place, and time.     Cranial Nerves: No cranial nerve deficit.     Sensory: No sensory deficit.     ED Results / Procedures / Treatments   Labs (all labs ordered are listed, but only abnormal results are displayed) Labs Reviewed  COMPREHENSIVE METABOLIC PANEL - Abnormal; Notable for the following components:      Result Value   Glucose, Bld 154 (*)    Calcium 8.8 (*)    All other components within normal limits  SARS CORONAVIRUS 2 (TAT 6-24 HRS)  LIPASE, BLOOD  CBC    EKG None  Radiology No results found.  Procedures Procedures (including critical care  time)  Medications Ordered in ED Medications  sodium chloride flush (NS) 0.9 % injection 3 mL (has no administration in time range)    ED Course  I have reviewed the triage vital signs and the nursing notes.  Pertinent labs & imaging results  that were available during my care of the patient were reviewed by me and considered in my medical decision making (see chart for details).   MDM Rules/Calculators/A&P  Patient presenting here with complaints of headache, cough, and intermittent shortness of breath.  She has family members at home with COVID-19.  I suspect that this is what she has as well.  There is no hypoxia, vitals are stable, and her exam is reassuring.  At this point, I do not feel as though further work-up is indicated.  A Covid test will be obtained and patient will be discharged.  She will be given a prescription for Zithromax.  If her Covid test is negative, she can get this filled.  If positive, patient to continue symptomatic treatment.  Final Clinical Impression(s) / ED Diagnoses Final diagnoses:  None    Rx / DC Orders ED Discharge Orders    None       Veryl Speak, MD 02/19/19 1807

## 2019-02-19 NOTE — Discharge Instructions (Signed)
Tylenol 1000 mg rotated with ibuprofen 600 mg every 4 hours as needed for pain or fever.  Drink plenty of fluids and get plenty of rest.  If the results of your Covid test are negative, fill the prescription for Zithromax you have been given this evening.  If the test is positive, continue isolating herself at home until you feel better +1-week.        Infection Prevention Recommendations for Individuals Confirmed to have, or Being Evaluated for, 2019 Novel Coronavirus (COVID-19) Infection Who Receive Care at Home  Individuals who are confirmed to have, or are being evaluated for, COVID-19 should follow the prevention steps below until a healthcare provider or local or state health department says they can return to normal activities.  Stay home except to get medical care You should restrict activities outside your home, except for getting medical care. Do not go to work, school, or public areas, and do not use public transportation or taxis.  Call ahead before visiting your doctor Before your medical appointment, call the healthcare provider and tell them that you have, or are being evaluated for, COVID-19 infection. This will help the healthcare provider's office take steps to keep other people from getting infected. Ask your healthcare provider to call the local or state health department.  Monitor your symptoms Seek prompt medical attention if your illness is worsening (e.g., difficulty breathing). Before going to your medical appointment, call the healthcare provider and tell them that you have, or are being evaluated for, COVID-19 infection. Ask your healthcare provider to call the local or state health department.  Wear a facemask You should wear a facemask that covers your nose and mouth when you are in the same room with other people and when you visit a healthcare provider. People who live with or visit you should also wear a facemask while they are in the same room with  you.  Separate yourself from other people in your home As much as possible, you should stay in a different room from other people in your home. Also, you should use a separate bathroom, if available.  Avoid sharing household items You should not share dishes, drinking glasses, cups, eating utensils, towels, bedding, or other items with other people in your home. After using these items, you should wash them thoroughly with soap and water.  Cover your coughs and sneezes Cover your mouth and nose with a tissue when you cough or sneeze, or you can cough or sneeze into your sleeve. Throw used tissues in a lined trash can, and immediately wash your hands with soap and water for at least 20 seconds or use an alcohol-based hand rub.  Wash your [facility] your hands often and thoroughly with soap and water for at least 20 seconds. You can use an alcohol-based hand sanitizer if soap and water are not available and if your hands are not visibly dirty. Avoid touching your eyes, nose, and mouth with unwashed hands.   Prevention Steps for Caregivers and Household Members of Individuals Confirmed to have, or Being Evaluated for, COVID-19 Infection Being Cared for in the Home  If you live with, or provide care at home for, a person confirmed to have, or being evaluated for, COVID-19 infection please follow these guidelines to prevent infection:  Follow healthcare provider's instructions Make sure that you understand and can help the patient follow any healthcare provider instructions for all care.  Provide for the patient's basic needs You should help the patient with basic  needs in the home and provide support for getting groceries, prescriptions, and other personal needs.  Monitor the patient's symptoms If they are getting sicker, call his or her medical provider and tell them that the patient has, or is being evaluated for, COVID-19 infection. This will help the healthcare provider's office  take steps to keep other people from getting infected. Ask the healthcare provider to call the local or state health department.  Limit the number of people who have contact with the patient If possible, have only one caregiver for the patient. Other household members should stay in another home or place of residence. If this is not possible, they should stay in another room, or be separated from the patient as much as possible. Use a separate bathroom, if available. Restrict visitors who do not have an essential need to be in the home.  Keep older adults, very young children, and other sick people away from the patient Keep older adults, very young children, and those who have compromised immune systems or chronic health conditions away from the patient. This includes people with chronic heart, lung, or kidney conditions, diabetes, and cancer.  Ensure good ventilation Make sure that shared spaces in the home have good air flow, such as from an air conditioner or an opened window, weather permitting.  Wash your hands often Wash your hands often and thoroughly with soap and water for at least 20 seconds. You can use an alcohol based hand sanitizer if soap and water are not available and if your hands are not visibly dirty. Avoid touching your eyes, nose, and mouth with unwashed hands. Use disposable paper towels to dry your hands. If not available, use dedicated cloth towels and replace them when they become wet.  Wear a facemask and gloves Wear a disposable facemask at all times in the room and gloves when you touch or have contact with the patient's blood, body fluids, and/or secretions or excretions, such as sweat, saliva, sputum, nasal mucus, vomit, urine, or feces.  Ensure the mask fits over your nose and mouth tightly, and do not touch it during use. Throw out disposable facemasks and gloves after using them. Do not reuse. Wash your hands immediately after removing your facemask and  gloves. If your personal clothing becomes contaminated, carefully remove clothing and launder. Wash your hands after handling contaminated clothing. Place all used disposable facemasks, gloves, and other waste in a lined container before disposing them with other household waste. Remove gloves and wash your hands immediately after handling these items.  Do not share dishes, glasses, or other household items with the patient Avoid sharing household items. You should not share dishes, drinking glasses, cups, eating utensils, towels, bedding, or other items with a patient who is confirmed to have, or being evaluated for, COVID-19 infection. After the person uses these items, you should wash them thoroughly with soap and water.  Wash laundry thoroughly Immediately remove and wash clothes or bedding that have blood, body fluids, and/or secretions or excretions, such as sweat, saliva, sputum, nasal mucus, vomit, urine, or feces, on them. Wear gloves when handling laundry from the patient. Read and follow directions on labels of laundry or clothing items and detergent. In general, wash and dry with the warmest temperatures recommended on the label.  Clean all areas the individual has used often Clean all touchable surfaces, such as counters, tabletops, doorknobs, bathroom fixtures, toilets, phones, keyboards, tablets, and bedside tables, every day. Also, clean any surfaces that may have blood,  body fluids, and/or secretions or excretions on them. Wear gloves when cleaning surfaces the patient has come in contact with. Use a diluted bleach solution (e.g., dilute bleach with 1 part bleach and 10 parts water) or a household disinfectant with a label that says EPA-registered for coronaviruses. To make a bleach solution at home, add 1 tablespoon of bleach to 1 quart (4 cups) of water. For a larger supply, add  cup of bleach to 1 gallon (16 cups) of water. Read labels of cleaning products and follow  recommendations provided on product labels. Labels contain instructions for safe and effective use of the cleaning product including precautions you should take when applying the product, such as wearing gloves or eye protection and making sure you have good ventilation during use of the product. Remove gloves and wash hands immediately after cleaning.  Monitor yourself for signs and symptoms of illness Caregivers and household members are considered close contacts, should monitor their health, and will be asked to limit movement outside of the home to the extent possible. Follow the monitoring steps for close contacts listed on the symptom monitoring form.   ? If you have additional questions, contact your local health department or call the epidemiologist on call at [phone] (available 24/7). ? This guidance is subject to change. For the most up-to-date guidance from [facility], please refer to their website: [email or web address]

## 2024-03-10 ENCOUNTER — Ambulatory Visit
Admission: RE | Admit: 2024-03-10 | Discharge: 2024-03-10 | Disposition: A | Discharge status: Home / Self Care | Payer: Self-pay | Source: Ambulatory Visit

## 2024-03-10 VITALS — BP 153/83 | HR 59 | Temp 98.2°F | Resp 16

## 2024-03-10 DIAGNOSIS — S0010XA Contusion of unspecified eyelid and periocular area, initial encounter: Secondary | ICD-10-CM

## 2024-03-10 DIAGNOSIS — S0083XA Contusion of other part of head, initial encounter: Secondary | ICD-10-CM

## 2024-03-10 DIAGNOSIS — M542 Cervicalgia: Secondary | ICD-10-CM

## 2024-03-10 DIAGNOSIS — S0990XA Unspecified injury of head, initial encounter: Secondary | ICD-10-CM

## 2024-03-10 MED ORDER — TIZANIDINE HCL 4 MG PO TABS: 4.0000 mg | ORAL_TABLET | Freq: Three times a day (TID) | ORAL | 0 refills | Status: AC | PRN

## 2024-03-10 NOTE — ED Provider Notes (Signed)
 " [facility]    CSN: [id number] Arrival date & time: 03/10/24  1532      History   Chief Complaint Chief Complaint  Patient presents with   Eye Problem    Got hit on the forehead on Thursday 02/03/25. Something hard plastic fell off the top of a box.  Had a goose egg swelling went down but eye turned black and slight right neck pain. Just want checked out to be sure ok. This happened at work. - Entered by patient   Neck Pain    HPI Sandra Benson is a 59 y.o. female.   Patient presents today with forehead pain and bilateral eye bruising ongoing for the past couple of days.  Reports she was lifting a heavy box overhead and did not realize there is a thick piece of plastic on the box.  Reports when she began to pull the box down, the plastic fell onto her forehead.  Reports she had to hold the box overhead for a couple of minutes and call for help because she thought that the plastic was glass and did not want it to shatter.  She is now having bruising and swelling and is concerned that there is swelling around the eyes.  She has been applying ice which seems to help.  Has not taken any ibuprofen  or Motrin  yet.  Denies loss of consciousness, nausea or vomiting, confusion, emotional changes, or gait instability since this occurred.  Patient also concerned about neck pain in the right side of her neck/upper back.  Reports she feels like there is a bump under the skin.  No redness or bruising.  No decreased range of motion of the neck.  Medical interpreter was used for today's visit-Nicole #[id number]    Past Medical History:  Diagnosis Date   Fibroids    Hypertension     Patient Active Problem List   Diagnosis Date Noted   Impingement syndrome of left shoulder 03/02/2016   Primary osteoarthritis of right hip 03/02/2016   Chest pain 11/05/2013    Past Surgical History:  Procedure Laterality Date   ABDOMINAL HYSTERECTOMY N/A 10/22/2012   Procedure: HYSTERECTOMY ABDOMINAL;   Surgeon: Norleen LULLA Server, MD;  Location: [facility];  Service: Gynecology;  Laterality: N/A;   TUBAL LIGATION      OB History     Gravida  4   Para  4   Term      Preterm      AB      Living  4      SAB      IAB      Ectopic      Multiple      Live Births  4            Home Medications    Prior to Admission medications  Medication Sig Start Date End Date Taking? Authorizing Provider  tiZANidine  (ZANAFLEX ) 4 MG tablet Take 1 tablet (4 mg total) by mouth every 8 (eight) hours as needed for muscle spasms. Do not take with alcohol or while driving or operating heavy machinery.  May cause drowsiness. 03/10/24  Yes Chandra Raisin A, NP  amLODipine  (NORVASC ) 5 MG tablet Take 1 tablet (5 mg total) by mouth daily. Patient not taking: Reported on 03/02/2016 12/31/14   Oleta Berwyn LABOR, NP  aspirin 81 MG tablet Take 81 mg by mouth daily.    [provider]  azithromycin  (ZITHROMAX ) 250 MG tablet Take 1 tablet (250 mg total)  by mouth daily. Take first 2 tablets together, then 1 every day until finished. 02/19/19   Geroldine Berg, MD  benzonatate  (TESSALON ) 100 MG capsule Take 1 capsule (100 mg total) by mouth 3 (three) times daily as needed for cough. Patient not taking: Reported on 05/17/2017 03/02/16   Durenda Alston SAUNDERS, FNP  diclofenac  (VOLTAREN ) 75 MG EC tablet Take 1 tablet (75 mg total) by mouth 2 (two) times daily. Patient not taking: Reported on 03/02/2016 03/02/16   Jerri Kay HERO, MD  fluticasone  (FLONASE ) 50 MCG/ACT nasal spray Place 2 sprays into both nostrils daily. For 7 days. Patient not taking: Reported on 05/17/2017 03/02/16   Durenda Alston SAUNDERS, FNP  hydrochlorothiazide  (MICROZIDE ) 12.5 MG capsule Take 1 capsule (12.5 mg total) by mouth daily. Patient not taking: Reported on 03/02/2016 09/30/15   McClung, Angela M, PA-C  ibuprofen  (ADVIL ,MOTRIN ) 400 MG tablet Take 1 tablet (400 mg total) by mouth every 6 (six) hours as needed (Take with food.). Patient not taking:  Reported on 03/02/2016 02/04/16   Durenda Alston SAUNDERS, FNP  methocarbamol  (ROBAXIN ) 500 MG tablet Take 1 tablet (500 mg total) by mouth every 6 (six) hours as needed for muscle spasms. Patient not taking: Reported on 03/02/2016 03/02/16   Jerri Kay HERO, MD  Multiple Vitamin (MULTIVITAMIN WITH MINERALS) TABS tablet Take 1 tablet by mouth daily.    [provider]  omega-3 acid ethyl esters (LOVAZA ) 1 G capsule Take 2 capsules (2 g total) by mouth 2 (two) times daily. Patient not taking: Reported on 03/02/2016 01/19/15   Oleta Berwyn LABOR, NP  omeprazole  (PRILOSEC) 20 MG capsule Take 1 capsule (20 mg total) by mouth daily. Patient not taking: Reported on 03/02/2016 02/04/16   Durenda Alston SAUNDERS, FNP  traMADol  (ULTRAM ) 50 MG tablet Take 1 tablet (50 mg total) by mouth every 8 (eight) hours as needed for moderate pain or severe pain. Patient not taking: Reported on 05/17/2017 03/02/16   Durenda Alston SAUNDERS, FNP    Family History Family History  Problem Relation Age of Onset   Diabetes Mother    Diabetes Brother     Social History Social History[1]   Allergies   Patient has no known allergies.   Review of Systems Review of Systems Per HPI  Physical Exam Triage Vital Signs ED Triage Vitals  Encounter Vitals Group     BP      Girls Systolic BP Percentile      Girls Diastolic BP Percentile      Boys Systolic BP Percentile      Boys Diastolic BP Percentile      Pulse      Resp      Temp      Temp src      SpO2      Weight      Height      Head Circumference      Peak Flow      Pain Score      Pain Loc      Pain Education      Exclude from Growth Chart    No data found.  Updated Vital Signs BP (!) 153/83 (BP Location: Right Arm)   Pulse (!) 59   Temp 98.2 F (36.8 C) (Oral)   Resp 16   LMP 10/01/2012   SpO2 96%   Visual Acuity Right Eye Distance: 20/30 Left Eye Distance: 20/30 Bilateral Distance: 20/30  Right Eye Near:   Left Eye Near:    Bilateral  Near:      Physical Exam Vitals and nursing note reviewed.  Constitutional:      General: She is not in acute distress.    Appearance: Normal appearance. She is not toxic-appearing.  HENT:     Head: Normocephalic. Contusion present. No raccoon eyes, Battle's sign, right periorbital erythema or left periorbital erythema.      Comments: Contusion to right forehead and approximately area marked with bruising in different stages of healing; there is also bruising to the medial eyes at the top of the nasal bone.  No pain with extraocular movements.    Right Ear: External ear normal.     Left Ear: External ear normal.     Nose: Nose normal.     Mouth/Throat:     Mouth: Mucous membranes are moist.     Pharynx: Oropharynx is clear.  Eyes:     General: No scleral icterus.    Extraocular Movements: Extraocular movements intact.     Right eye: Normal extraocular motion.     Left eye: Normal extraocular motion.     Pupils: Pupils are equal, round, and reactive to light.  Neck:      Comments: Pain to neck and area marked; no bruising, swelling, obvious deformity, redness.  Patient has full range of motion of the neck and the neck is supple.  No cervical lymphadenopathy. Cardiovascular:     Rate and Rhythm: Normal rate and regular rhythm.  Pulmonary:     Effort: Pulmonary effort is normal. No respiratory distress.     Breath sounds: Normal breath sounds. No wheezing, rhonchi or rales.  Musculoskeletal:     Cervical back: Normal range of motion. No edema, erythema, rigidity, torticollis or crepitus. Pain with movement and muscular tenderness present. No spinous process tenderness. Normal range of motion.  Lymphadenopathy:     Cervical: No cervical adenopathy.  Skin:    General: Skin is warm and dry.     Capillary Refill: Capillary refill takes less than 2 seconds.     Coloration: Skin is not jaundiced or pale.     Findings: No erythema.  Neurological:     General: No focal deficit present.      Mental Status: She is alert and oriented to person, place, and time.     Cranial Nerves: Cranial nerves 2-12 are intact.     Sensory: Sensation is intact.     Motor: Motor function is intact.     Coordination: Coordination is intact. Romberg sign negative. Heel to [facility] Test normal. Rapid alternating movements normal.     Gait: Gait is intact. Gait normal.      UC Treatments / Results  Labs (all labs ordered are listed, but only abnormal results are displayed) Labs Reviewed - No data to display  EKG   Radiology No results found.  Procedures Procedures (including critical care time)  Medications Ordered in UC Medications - No data to display  Initial Impression / Assessment and Plan / UC Course  I have reviewed the triage vital signs and the nursing notes.  Pertinent labs & imaging results that were available during my care of the patient were reviewed by me and considered in my medical decision making (see chart for details).   Patient is a very pleasant, well-appearing 59 year old female presenting today for head injury and neck pain.  She is mildly hypertensive in triage, otherwise vital signs are stable.  Exam is reassuring today and visual acuity is normal.  Very low suspicion for  concussion or other intracranial abnormality.  Reassurance provided that bruising can be normal after a head injury.  Recommended light range of motion and stretching exercises for the neck pain, start muscle relaxant and massage additionally.  Return and ER precautions discussed.  The patient was given the opportunity to ask questions.  All questions answered to their satisfaction.  The patient is in agreement to this plan.   Final Clinical Impressions(s) / UC Diagnoses   Final diagnoses:  Injury of head, initial encounter  Contusion of forehead, initial encounter  Periorbital ecchymosis, unspecified laterality, initial encounter  Neck pain     Discharge Instructions      You were seen  today for a contusion of your forehead and bruising around your eyes.  This can be normal after blunt trauma to the head.  Continue to apply ice.  Seek care in the emergency room if you develop a severe headache, decreased range of motion of the neck, loss of consciousness, nausea or vomiting, vision changes.  For the neck pain, recommend light range of motion and stretching, massage to the area.  You can also apply heat.  You can also take the muscle relaxant as needed for muscular pain.  Seek care if symptoms do not improve with treatment.  Hoy le examinaron por una contusin en la frente y hematomas alrededor de los ojos. Esto es normal despus de un traumatismo craneal. Contine aplicando hielo. Acuda a la sala de emergencias si presenta dolor de cabeza intenso, limitacin del movimiento del cuello, prdida del conocimiento, nuseas o vmitos, o cambios en la visin.  Para el dolor de cuello, se recomienda realizar movimientos suaves y estiramientos, y engineer, maintenance (it) la zona. Tambin puede aplicar calor. Puede tomar el relajante muscular segn sea necesario para el dolor muscular. Consulte con un mdico si los sntomas no mejoran con el tratamiento.     ED Prescriptions     Medication Sig Dispense Auth. Provider   tiZANidine  (ZANAFLEX ) 4 MG tablet Take 1 tablet (4 mg total) by mouth every 8 (eight) hours as needed for muscle spasms. Do not take with alcohol or while driving or operating heavy machinery.  May cause drowsiness. 30 tablet Chandra Harlene LABOR, NP      PDMP not reviewed this encounter.    [1]  Social History Tobacco Use   Smoking status: Never   Smokeless tobacco: Never  Vaping Use   Vaping status: Never Used  Substance Use Topics   Alcohol use: No   Drug use: No     Chandra Harlene LABOR, NP 03/10/24 1644  "

## 2024-03-10 NOTE — ED Triage Notes (Signed)
 Pt states she got hit on the forehead on 03/06/24. Pt says hard plastic fell off the top of a box and hit her. Now pt is having when moving her neck in her right side and slight blurry vision in her right eye that comes and goes.   Pt states it is not workers comp.

## 2024-03-10 NOTE — Discharge Instructions (Addendum)
 You were seen today for a contusion of your forehead and bruising around your eyes.  This can be normal after blunt trauma to the head.  Continue to apply ice.  Seek care in the emergency room if you develop a severe headache, decreased range of motion of the neck, loss of consciousness, nausea or vomiting, vision changes.  For the neck pain, recommend light range of motion and stretching, massage to the area.  You can also apply heat.  You can also take the muscle relaxant as needed for muscular pain.  Seek care if symptoms do not improve with treatment.  Hoy le examinaron por una contusin en la frente y hematomas alrededor de los ojos. Esto es normal despus de un traumatismo craneal. Contine aplicando hielo. Acuda a la sala de emergencias si presenta dolor de cabeza intenso, limitacin del movimiento del cuello, prdida del conocimiento, nuseas o vmitos, o cambios en la visin.  Para el dolor de cuello, se recomienda realizar movimientos suaves y estiramientos, y engineer, maintenance (it) la zona. Tambin puede aplicar calor. Puede tomar el relajante muscular segn sea necesario para el dolor muscular. Consulte con un mdico si los sntomas no mejoran con el tratamiento.
